# Patient Record
Sex: Female | Born: 1965 | Race: White | Hispanic: No | Smoking: Never smoker
Health system: Southern US, Community
[De-identification: ages and names within clinical notes are randomized; demographics above are authoritative.]

## PROBLEM LIST (undated history)

## (undated) DIAGNOSIS — Q211 Atrial septal defect: Secondary | ICD-10-CM

## (undated) DIAGNOSIS — S3550XA Injury of unspecified iliac blood vessel(s), initial encounter: Secondary | ICD-10-CM

## (undated) DIAGNOSIS — R51 Headache: Secondary | ICD-10-CM

## (undated) DIAGNOSIS — I471 Supraventricular tachycardia, unspecified: Secondary | ICD-10-CM

## (undated) DIAGNOSIS — R29818 Other symptoms and signs involving the nervous system: Secondary | ICD-10-CM

## (undated) DIAGNOSIS — E039 Hypothyroidism, unspecified: Secondary | ICD-10-CM

## (undated) DIAGNOSIS — Q2112 Patent foramen ovale: Secondary | ICD-10-CM

## (undated) DIAGNOSIS — N200 Calculus of kidney: Secondary | ICD-10-CM

## (undated) DIAGNOSIS — R002 Palpitations: Secondary | ICD-10-CM

## (undated) DIAGNOSIS — R519 Headache, unspecified: Secondary | ICD-10-CM

## (undated) DIAGNOSIS — N281 Cyst of kidney, acquired: Secondary | ICD-10-CM

## (undated) HISTORY — DX: Palpitations: R00.2

## (undated) HISTORY — PX: WISDOM TOOTH EXTRACTION: SHX21

## (undated) HISTORY — PX: KIDNEY STONE SURGERY: SHX686

## (undated) HISTORY — PX: ABDOMINAL HYSTERECTOMY: SHX81

## (undated) HISTORY — PX: THYROIDECTOMY: SHX17

## (undated) HISTORY — PX: PARATHYROIDECTOMY: SHX19

## (undated) HISTORY — DX: Patent foramen ovale: Q21.12

## (undated) HISTORY — DX: Atrial septal defect: Q21.1

## (undated) HISTORY — PX: TONSILLECTOMY: SUR1361

## (undated) HISTORY — PX: BREAST SURGERY: SHX581

---

## 1999-12-21 HISTORY — PX: LYMPH NODE BIOPSY: SHX201

## 2001-12-20 HISTORY — PX: KIDNEY SURGERY: SHX687

## 2004-08-14 ENCOUNTER — Encounter: Admission: RE | Admit: 2004-08-14 | Discharge: 2004-08-14 | Payer: Self-pay | Admitting: Obstetrics and Gynecology

## 2004-09-04 ENCOUNTER — Ambulatory Visit (HOSPITAL_COMMUNITY): Admission: RE | Admit: 2004-09-04 | Discharge: 2004-09-04 | Payer: Self-pay | Admitting: Obstetrics and Gynecology

## 2005-08-26 ENCOUNTER — Encounter: Admission: RE | Admit: 2005-08-26 | Discharge: 2005-08-26 | Payer: Self-pay | Admitting: Obstetrics and Gynecology

## 2005-12-16 ENCOUNTER — Ambulatory Visit (HOSPITAL_BASED_OUTPATIENT_CLINIC_OR_DEPARTMENT_OTHER): Admission: RE | Admit: 2005-12-16 | Discharge: 2005-12-16 | Payer: Self-pay | Admitting: Urology

## 2005-12-16 ENCOUNTER — Encounter (INDEPENDENT_AMBULATORY_CARE_PROVIDER_SITE_OTHER): Payer: Self-pay | Admitting: Specialist

## 2005-12-16 ENCOUNTER — Ambulatory Visit (HOSPITAL_COMMUNITY): Admission: RE | Admit: 2005-12-16 | Discharge: 2005-12-16 | Payer: Self-pay | Admitting: Urology

## 2006-09-12 ENCOUNTER — Encounter: Admission: RE | Admit: 2006-09-12 | Discharge: 2006-09-12 | Payer: Self-pay | Admitting: Obstetrics and Gynecology

## 2006-09-20 ENCOUNTER — Encounter: Admission: RE | Admit: 2006-09-20 | Discharge: 2006-09-20 | Payer: Self-pay | Admitting: Obstetrics and Gynecology

## 2006-12-20 HISTORY — PX: BREAST IMPLANT EXCHANGE: SHX6296

## 2008-01-09 ENCOUNTER — Encounter: Admission: RE | Admit: 2008-01-09 | Discharge: 2008-01-09 | Payer: Self-pay | Admitting: Obstetrics and Gynecology

## 2008-12-20 HISTORY — PX: ELBOW SURGERY: SHX618

## 2009-04-28 ENCOUNTER — Encounter: Admission: RE | Admit: 2009-04-28 | Discharge: 2009-04-28 | Payer: Self-pay | Admitting: Obstetrics and Gynecology

## 2009-04-30 ENCOUNTER — Encounter: Admission: RE | Admit: 2009-04-30 | Discharge: 2009-04-30 | Payer: Self-pay | Admitting: Obstetrics and Gynecology

## 2010-05-15 ENCOUNTER — Encounter: Admission: RE | Admit: 2010-05-15 | Discharge: 2010-05-15 | Payer: Self-pay | Admitting: Obstetrics and Gynecology

## 2010-05-21 ENCOUNTER — Encounter: Admission: RE | Admit: 2010-05-21 | Discharge: 2010-05-21 | Payer: Self-pay | Admitting: Obstetrics and Gynecology

## 2011-01-10 ENCOUNTER — Encounter: Payer: Self-pay | Admitting: Obstetrics and Gynecology

## 2011-05-07 NOTE — Op Note (Signed)
Cheryl Irwin, Cheryl Irwin              ACCOUNT NO.:  [id number]   MEDICAL RECORD NO.:  [id number]          PATIENT TYPE:  AMB   LOCATION:  [facility]                         FACILITY:  [facility]   PHYSICIAN:  Ronald L. Earlene Plater, M.D.  DATE OF BIRTH:  Jun 14, 1966   DATE OF PROCEDURE:  12/16/2005  DATE OF DISCHARGE:                                 OPERATIVE REPORT   PREOPERATIVE DIAGNOSIS:  Gross hematuria, history of right ureteral  reimplantation, and positive MMP22.   PROCEDURE:  Cystourethroscopy, bilateral retrograde ureteral pyelograms,  bladder and bilateral renal pelvis washings.   SURGEON:  Lucrezia Starch. Earlene Plater, M.D.   ANESTHESIA:  LMA.   ESTIMATED BLOOD LOSS:  Negligible.   TUBES:  None.   COMPLICATIONS:  None.   INDICATIONS FOR PROCEDURE:  Ms. Messineo is a lovely 45 year old white female  who presents with a rather complicated history.  She had undergone a  hysterectomy in 1997 and subsequently in 2002 in [place] and subsequently  developed gross hematuria and right flank pain in 2002.  She was found to  have an obstructed ureter secondary to the hysterectomy and a right  hydroureteronephrosis.  She underwent a right ureteral reimplant complicated  by an iliac artery injury requiring thrombectomy and was noted approximately  2 years ago on IVP to have still some hydronephrosis, however, felt the  kidney remained 75 to 80% functional and was stable.  She was doing well,  but developed again gross hematuria and passed clots.  She was concerned and  wanted a formal workup.  In the office an NMP22 was noted to be positive and  on CT scan, she was found to have two 3 mm calculi in the right kidney with  no obstruction, 1.6 cm Bosnic category I cyst of the upper pole of the right  kidney.  Two small stones were in the upper pole of the right kidney and  were peripheral and did not appear to be problematic.  No hydronephrosis or  other significant abnormalities were noted.  After understanding  risks,  benefits, and alternatives, she elected to proceed with above procedure.   DESCRIPTION OF PROCEDURE:  The patient was placed in the supine position.  After proper LMA anesthesia, she was placed in the dorsal lithotomy position  and prepped and draped with Betadine in a sterile fashion.  Cystourethroscopy was performed with a 22.5 [place] Olympus panendoscope.  Utilizing the 12 and 70 degree lenses, the bladder was carefully inspected.  It had smooth wall.  Efflux of clear urine was noted from the normally  placed left ureteral orifice.  The right defunctionalized ureteral orifice  was in the normal location and the reimplanted ureter was noted at the right  posterior bladder wall and appeared to be fully patent.  A 6 French open-  ended catheter was used to perform a retrograde ureteral pyelogram on the  right side.  There was some angulation of the ureter in the lower portion,  but it appeared to be fully patent.  It appeared to drain well and no  significant filling defects were noted to be within the system.  It might be  noted at the level of the vessels, there was some angulation noted, but it  did not appear to be a filling defect.  The catheter was then passed into  the right renal pelvis and barbotage cytologies were obtained and submitted  for cytology.  Similar left retrograde ureteral pyelogram was performed and  was noted to be essentially normal.  It drained normal and left renal pelvic  washings were performed in a similar manner.  It might be noted that  barbotage bladder cytology has been performed and submitted to pathology  when the bladder was initially inspected.  Again, the system appeared to  drain well.  Efflux of clear urine was noted from the left ureteral orifice  as noted and efflux was noted from the reimplanted right ureteral orifice.  The patient tolerated the procedure well and was taken to the recovery room  stable.      Ronald L. Earlene Plater, M.D.   Electronically Signed     RLD/MEDQ  D:  12/16/2005  T:  12/16/2005  Job:  [id number]   cc:   Sherry A. Rosalio Macadamia, M.D.  Fax: [phone]

## 2011-05-26 ENCOUNTER — Other Ambulatory Visit: Payer: Self-pay | Admitting: Obstetrics & Gynecology

## 2011-05-26 DIAGNOSIS — Z1231 Encounter for screening mammogram for malignant neoplasm of breast: Secondary | ICD-10-CM

## 2011-06-10 ENCOUNTER — Ambulatory Visit
Admission: RE | Admit: 2011-06-10 | Discharge: 2011-06-10 | Disposition: A | Discharge status: Home / Self Care | Payer: 59 | Source: Ambulatory Visit | Attending: Obstetrics & Gynecology | Admitting: Obstetrics & Gynecology

## 2011-06-10 ENCOUNTER — Other Ambulatory Visit: Payer: Self-pay | Admitting: Obstetrics & Gynecology

## 2011-06-10 DIAGNOSIS — N63 Unspecified lump in unspecified breast: Secondary | ICD-10-CM

## 2011-06-10 DIAGNOSIS — Z1231 Encounter for screening mammogram for malignant neoplasm of breast: Secondary | ICD-10-CM

## 2012-07-28 ENCOUNTER — Ambulatory Visit (INDEPENDENT_AMBULATORY_CARE_PROVIDER_SITE_OTHER): Payer: 59

## 2012-07-29 ENCOUNTER — Ambulatory Visit (INDEPENDENT_AMBULATORY_CARE_PROVIDER_SITE_OTHER): Payer: 59 | Admitting: Family Medicine

## 2012-07-29 VITALS — BP 96/62 | HR 63 | Temp 98.2°F | Resp 16 | Ht 63.0 in | Wt 117.8 lb

## 2012-07-29 DIAGNOSIS — T169XXA Foreign body in ear, unspecified ear, initial encounter: Secondary | ICD-10-CM

## 2012-07-29 DIAGNOSIS — H60339 Swimmer's ear, unspecified ear: Secondary | ICD-10-CM

## 2012-07-29 DIAGNOSIS — H9209 Otalgia, unspecified ear: Secondary | ICD-10-CM

## 2012-07-29 DIAGNOSIS — H6090 Unspecified otitis externa, unspecified ear: Secondary | ICD-10-CM

## 2012-07-29 NOTE — Progress Notes (Signed)
  Subjective:    Patient ID: Cheryl Irwin, female    DOB: 1966/12/17, 46 y.o.   MRN: [id number]  HPI 46 year old female comes into our office today with complaints of left ear pain she went out of town for 6 weeks and returned with and ear ache she went to see Dr Barton Dubois she prescribed her with Augmentin it didn't help now the ear is closed up she states that it fills like something is in it The problem began after going [place] in [place] about 6 weeks ago.  Review of Systems  HENT: Positive for hearing loss (fills like some is in it foe 3 dayd) and ear pain. Ear discharge: not in 3 to 4 days but it was some drainage.        Objective:   Physical Exam Left ear canal:  Small amount of debris in left ear with pain on manipulation with left ear canal swelling    foreign body irrigated out:  Appears to be end of cotton swab Assessment & Plan:  Otitis externa  Plan: cipro 500 bid x 7 days Stop the augmentin

## 2012-10-19 ENCOUNTER — Other Ambulatory Visit: Payer: Self-pay | Admitting: Obstetrics & Gynecology

## 2012-10-19 DIAGNOSIS — N6310 Unspecified lump in the right breast, unspecified quadrant: Secondary | ICD-10-CM

## 2012-10-30 ENCOUNTER — Ambulatory Visit
Admission: RE | Admit: 2012-10-30 | Discharge: 2012-10-30 | Disposition: A | Discharge status: Home / Self Care | Payer: 59 | Source: Ambulatory Visit | Attending: Obstetrics & Gynecology | Admitting: Obstetrics & Gynecology

## 2012-10-30 DIAGNOSIS — N6310 Unspecified lump in the right breast, unspecified quadrant: Secondary | ICD-10-CM

## 2013-04-30 ENCOUNTER — Other Ambulatory Visit: Payer: Self-pay | Admitting: Surgery

## 2013-05-21 ENCOUNTER — Other Ambulatory Visit: Payer: Self-pay | Admitting: Surgery

## 2013-07-19 ENCOUNTER — Encounter: Payer: Self-pay | Admitting: Cardiovascular Disease

## 2013-07-19 ENCOUNTER — Ambulatory Visit (INDEPENDENT_AMBULATORY_CARE_PROVIDER_SITE_OTHER): Payer: 59 | Admitting: Cardiovascular Disease

## 2013-07-19 VITALS — BP 100/72 | HR 73 | Ht 63.0 in | Wt 112.8 lb

## 2013-07-19 DIAGNOSIS — R079 Chest pain, unspecified: Secondary | ICD-10-CM

## 2013-07-19 DIAGNOSIS — Q2111 Secundum atrial septal defect: Secondary | ICD-10-CM

## 2013-07-19 DIAGNOSIS — Q211 Atrial septal defect: Secondary | ICD-10-CM | POA: Insufficient documentation

## 2013-07-19 DIAGNOSIS — N429 Disorder of prostate, unspecified: Secondary | ICD-10-CM | POA: Insufficient documentation

## 2013-07-19 DIAGNOSIS — R002 Palpitations: Secondary | ICD-10-CM

## 2013-07-19 LAB — CBC
HCT: 37.7 % (ref 36.0–46.0)
Platelets: 249 10*3/uL (ref 150–400)
RDW: 13.4 % (ref 11.5–15.5)
WBC: 6.5 10*3/uL (ref 4.0–10.5)

## 2013-07-19 LAB — COMPREHENSIVE METABOLIC PANEL
ALT: 15 U/L (ref 0–35)
CO2: 28 mEq/L (ref 19–32)
Calcium: 8.6 mg/dL (ref 8.4–10.5)
Chloride: 103 mEq/L (ref 96–112)
Creat: 0.77 mg/dL (ref 0.50–1.10)
Total Protein: 7 g/dL (ref 6.0–8.3)

## 2013-07-19 LAB — T4, FREE: Free T4: 1.29 ng/dL (ref 0.80–1.80)

## 2013-07-19 NOTE — Patient Instructions (Addendum)
Dr Allyson Sabal has ordered an echocardiogram with bubble study and an exercise myoview (stress test)  We will schedule you to have a loop recorder placed next week by Dr Royann Shivers at [facility]  Bloodwork to be done today and at your convenience

## 2013-07-19 NOTE — Assessment & Plan Note (Signed)
Patient has complained of episodes of tachypalpitations or self-limited lasting up to 30 seconds at a time beginning at age 47 occurring several times a year. I really saw her back in 2008 and may have gotten a Holter monitor. I did get a 2-D echo bubble study that showed a PFO by her account. She was in the [place] last week and developed an episode of tachypalpitations with chest pain rating to her jaw neck and arm associated with shortness of breath lasted 3 hours. Since that time she's felt weak. I'm going to obtain routine labs including a TSH since she is on Synthroid and a troponin to make sure she didn't have a ischemic episode although it may be part of doubt that this may have no relevance. Am also going to get 2-D echocardiogram, exercise Myoview stress test and implant a loop recorder to determine whether or not there is an arrhythmogenic component, ventricular or supraventricular.

## 2013-07-19 NOTE — Progress Notes (Signed)
07/19/2013 CHRYSTEN WOULFE   [birth year]  [id number]  Primary Physician Mady Gemma PA-C Primary Cardiologist: Runell Gess MD Roseanne Reno   HPI:  Cheryl Irwin is a delightful 47 year old thin and fit appearing married Caucasian female mother of 2 children who works as a Airline pilot at [employer]. Her primary care doctor is Mady Gemma . And her OB/GYN is Dr. Juliene Pina.she has a history of tachycardia palpitations dating back to age 81 occurring several times a year which were self-limited. She travels internationally he was recently [place] [employer]. She developed onset of tachycardia palpitations with chest pressure radiating to her neck arm and left upper extremity associated with shortness of breath and diaphoresis this lasted approximately 3 hours and then resolved spontaneously. She's been weak ever since. Her father did have history of myocardial function and age 32 she has no other cardiac risk factors.   Current Outpatient Prescriptions  Medication Sig Dispense Refill  . estradiol (ESTRACE) 2 MG tablet Take 2 mg by mouth daily.      Marland Kitchen levothyroxine (SYNTHROID, LEVOTHROID) 75 MCG tablet Take 75 mcg by mouth daily.       No current facility-administered medications for this visit.    Allergies  Allergen Reactions  . Sulfa Antibiotics Hives    History   Social History  . Marital Status: Married    Spouse Name: N/A    Number of Children: N/A  . Years of Education: N/A   Occupational History  . Not on file.   Social History Main Topics  . Smoking status: Never Smoker   . Smokeless tobacco: Not on file  . Alcohol Use: Not on file  . Drug Use: Not on file  . Sexually Active: Not on file   Other Topics Concern  . Not on file   Social History Narrative  . No narrative on file     Review of Systems: General: negative for chills, fever, night sweats or weight changes.  Cardiovascular: negative for chest pain, dyspnea on  exertion, edema, orthopnea, palpitations, paroxysmal nocturnal dyspnea or shortness of breath Dermatological: negative for rash Respiratory: negative for cough or wheezing Urologic: negative for hematuria Abdominal: negative for nausea, vomiting, diarrhea, bright red blood per rectum, melena, or hematemesis Neurologic: negative for visual changes, syncope, or dizziness All other systems reviewed and are otherwise negative except as noted above.    Blood pressure 100/72, pulse 73, height 5\' 3"  (1.6 m), weight 112 lb 12.8 oz (51.166 kg).  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 I really saw her back in 2008 and may have gotten a Holter monitor. I did get a 2-D echo bubble study that showed a PFO by her account. She was [place] last [place] and developed an episode of tachypalpitations with chest pain rating to her jaw neck and arm associated with shortness of breath lasted 3 hours. Since that time she's felt weak. I'm going to  obtain routine labs including a TSH since she is on Synthroid and a troponin to make sure she didn't have a ischemic episode although it may be part of doubt that this may have no relevance. Am also going to get 2-D echocardiogram, exercise Myoview stress test and implant a loop recorder to determine whether or not there is an arrhythmogenic component, ventricular or  supraventricular.      Runell Gess MD FACP,FACC,FAHA, [facility] 07/19/2013 3:05 PM

## 2013-07-20 DIAGNOSIS — R002 Palpitations: Secondary | ICD-10-CM

## 2013-07-20 HISTORY — DX: Palpitations: R00.2

## 2013-07-20 LAB — LIPID PANEL
Cholesterol: 191 mg/dL (ref 0–200)
HDL: 65 mg/dL (ref >39)
Triglycerides: 100 mg/dL (ref <150)

## 2013-07-23 ENCOUNTER — Encounter (HOSPITAL_COMMUNITY): Payer: Self-pay | Admitting: Pharmacy Technician

## 2013-07-24 ENCOUNTER — Encounter: Payer: Self-pay | Admitting: *Deleted

## 2013-07-26 ENCOUNTER — Ambulatory Visit (HOSPITAL_COMMUNITY)
Admission: RE | Admit: 2013-07-26 | Discharge: 2013-07-26 | Disposition: A | Discharge status: Home / Self Care | Payer: 59 | Source: Ambulatory Visit | Attending: Cardiovascular Disease | Admitting: Cardiovascular Disease

## 2013-07-26 ENCOUNTER — Ambulatory Visit (HOSPITAL_BASED_OUTPATIENT_CLINIC_OR_DEPARTMENT_OTHER)
Admission: RE | Admit: 2013-07-26 | Discharge: 2013-07-26 | Disposition: A | Discharge status: Home / Self Care | Payer: 59 | Source: Ambulatory Visit | Attending: Cardiovascular Disease | Admitting: Cardiovascular Disease

## 2013-07-26 DIAGNOSIS — R079 Chest pain, unspecified: Secondary | ICD-10-CM

## 2013-07-26 DIAGNOSIS — R002 Palpitations: Secondary | ICD-10-CM

## 2013-07-26 DIAGNOSIS — Q211 Atrial septal defect: Secondary | ICD-10-CM

## 2013-07-26 MED ORDER — TECHNETIUM TC 99M SESTAMIBI GENERIC - CARDIOLITE
10.0000 | Freq: Once | INTRAVENOUS | Status: AC | PRN
  Administered 2013-07-26: 10 via INTRAVENOUS

## 2013-07-26 MED ORDER — TECHNETIUM TC 99M SESTAMIBI GENERIC - CARDIOLITE
30.0000 | Freq: Once | INTRAVENOUS | Status: AC | PRN
  Administered 2013-07-26: 30 via INTRAVENOUS

## 2013-07-26 NOTE — Progress Notes (Signed)
[facility]   2D echo with bubble study completed 07/26/2013.   Veda Canning, RDCS

## 2013-07-26 NOTE — Procedures (Addendum)
[facility] [address] [place] [address] [phone]  Cardiology Nuclear Med Study  JENISSE VULLO is a 47 y.o. female     MRN : [id number]     DOB: 25-Aug-1966  Procedure Date: 07/26/2013  Nuclear Med Background Indication for Stress Test:  Evaluation for Ischemia History:  PFO Cardiac Risk Factors: Family History - CAD  Symptoms:  Chest Pain, Dizziness, Fatigue, Light-Headedness, Near Syncope, Palpitations, SOB, Syncope and TACHYCARDIA   Nuclear Pre-Procedure Caffeine/Decaff Intake:  7:00pm NPO After: 5:00am   IV Site: R Antecubital  IV 0.9% NS with Angio Cath:  22g  Chest Size (in):  N/A IV Started by: Emmit Pomfret, RN  Height: 5\' 3"  (1.6 m)  Cup Size: C with IMPLANTS  BMI:  Body mass index is 19.84 kg/(m^2). Weight:  112 lb (50.803 kg)   Tech Comments:  BILATERAL BREAST IMPLANTS     Nuclear Med Study 1 or 2 day study: 1 day  Stress Test Type:  Stress  Order Authorizing Provider:  Nanetta Batty, MD   Resting Radionuclide: Technetium 69m Sestamibi  Resting Radionuclide Dose: 10.5 mCi   Stress Radionuclide:  Technetium 51m Sestamibi  Stress Radionuclide Dose: 31.2 mCi           Stress Protocol Rest HR: 72 Stress HR:  166  Rest BP:107/81 Stress BP: 138/82  Exercise Time (min): 11 METS: 13.4   Predicted Max HR: 175 bpm % Max HR: 94.86 bpm Rate Pressure Product: 86578  Dose of Adenosine (mg):  n/a Dose of Lexiscan: n/a mg  Dose of Atropine (mg): n/a Dose of Dobutamine: n/a mcg/kg/min (at max HR)  Stress Test Technologist: Esperanza Sheets, CCT Nuclear Technologist: Koren Shiver, CNMT   Rest Procedure:  Myocardial perfusion imaging was performed at rest 45 minutes following the intravenous administration of Technetium 42m Sestamibi. Stress Procedure:  The patient performed treadmill exercise using a Bruce  Protocol for 11 minutes. The patient stopped due to target HR achieved and denied any chest pain.   There were no significant ST-T wave changes.  Technetium 44m Sestamibi was injected at peak exercise and myocardial perfusion imaging was performed after a brief delay.  Transient Ischemic Dilatation (Normal <1.22):  0.69 Lung/Heart Ratio (Normal <0.45):  0.38 QGS EDV:  42 ml QGS ESV:  4 ml LV Ejection Fraction: 89%     Rest ECG: NSR - Normal EKG  Stress ECG: No significant change from baseline ECG  QPS Raw Data Images:  Normal; no motion artifact; normal heart/lung ratio. Stress Images:  There is decreased uptake in the anterior wall. Rest Images:  Comparison with the stress images reveals no significant change. Subtraction (SDS):  There is a fixed anteriour defect that is most consistent with breast attenuation.  Impression Exercise Capacity:  Excellent exercise capacity. BP Response:  Normal blood pressure response. Clinical Symptoms:  No significant symptoms noted. ECG Impression:  No significant ST segment change suggestive of ischemia. Comparison with Prior Nuclear Study: No previous nuclear study performed  Overall Impression:  Low risk stress nuclear study with breast attenuation artifact.  LV Wall Motion:  NL LV Function; NL Wall Motion   Zurisadai Helminiak, MD  07/26/2013 12:40 PM

## 2013-07-27 ENCOUNTER — Encounter (HOSPITAL_COMMUNITY)
Admission: RE | Disposition: A | Discharge status: Home / Self Care | Payer: Self-pay | Source: Ambulatory Visit | Attending: Cardiovascular Disease

## 2013-07-27 ENCOUNTER — Ambulatory Visit (HOSPITAL_COMMUNITY)
Admission: RE | Admit: 2013-07-27 | Discharge: 2013-07-27 | Disposition: A | Discharge status: Home / Self Care | Payer: 59 | Source: Ambulatory Visit | Attending: Cardiovascular Disease | Admitting: Cardiovascular Disease

## 2013-07-27 DIAGNOSIS — Z79899 Other long term (current) drug therapy: Secondary | ICD-10-CM | POA: Insufficient documentation

## 2013-07-27 DIAGNOSIS — Z8249 Family history of ischemic heart disease and other diseases of the circulatory system: Secondary | ICD-10-CM | POA: Insufficient documentation

## 2013-07-27 DIAGNOSIS — R002 Palpitations: Secondary | ICD-10-CM | POA: Insufficient documentation

## 2013-07-27 DIAGNOSIS — Z882 Allergy status to sulfonamides status: Secondary | ICD-10-CM | POA: Insufficient documentation

## 2013-07-27 HISTORY — PX: LOOP RECORDER IMPLANT: SHX5477

## 2013-07-27 SURGERY — LOOP RECORDER IMPLANT
Anesthesia: LOCAL

## 2013-07-27 MED ORDER — MUPIROCIN 2 % EX OINT
TOPICAL_OINTMENT | Freq: Two times a day (BID) | CUTANEOUS | Status: DC
  Filled 2013-07-27 (×2): qty 22

## 2013-07-27 NOTE — Op Note (Addendum)
LOOP RECORDER IMPLANT   Procedure report  Procedure performed:  1. Loop recorder implantation  2. Light sedation  Reason for procedure:  1. Palpitations  Procedure performed by:  Thurmon Fair, MD  Complications:  None  Estimated blood loss:  <5 mL  Medications administered during procedure:  Ancef 2 g intravenously; no sedation was administered in the lab  Device details:  Medtronic Reveal Linq model number [id number], serial number [id number] Procedure details:  After the risks and benefits of the procedure were discussed the patient provided informed consent. He was brought to the cardiac catheter lab in the fasting state. The patient was prepped and draped in usual sterile fashion. Local anesthesia with 1% lidocaine was administered to an area 2 cm to the left of the sternum in the 4th intercostal space. A horizontal incision was made using the incision tool. The introducer was then used to create a subcutaneous tunnel and carefully deploy the device. Local pressure was held to ensure hemostasis. Device testing showed excellent electrograms. The incision was closed with SteriStrips and a sterile dressing was applied.   Thurmon Fair, MD, [facility] [phone] office [phone] pager 07/27/2013 10:40 AM

## 2013-07-27 NOTE — H&P (Signed)
  Date of Initial H&P: 07/19/13, Dr. Allyson Sabal  History reviewed, patient examined, no change in status, stable for surgery. Here for loop recorder implantation for unexplained palpitations. This procedure has been fully reviewed with the patient and written informed consent has been obtained. Thurmon Fair, MD, [facility] [phone] office [phone] pager

## 2013-08-02 ENCOUNTER — Encounter: Payer: Self-pay | Admitting: Physician Assistant

## 2013-08-02 ENCOUNTER — Ambulatory Visit (INDEPENDENT_AMBULATORY_CARE_PROVIDER_SITE_OTHER): Payer: 59 | Admitting: Physician Assistant

## 2013-08-02 VITALS — BP 114/66 | HR 68 | Ht 63.0 in | Wt 111.1 lb

## 2013-08-02 DIAGNOSIS — R002 Palpitations: Secondary | ICD-10-CM

## 2013-08-02 NOTE — Progress Notes (Signed)
Date:  08/02/2013   ID:  Cheryl Irwin, DOB [birth year], MRN [id number]  PCP:  Lilia Argue  Primary Cardiologist:  Allyson Sabal     History of Present Illness: Cheryl Irwin is a delightful 47 year old thin and fit appearing married Caucasian female mother of 2 children who works as a Airline pilot at [employer].  Her primary care doctor is Mady Gemma and her OB/GYN is Dr. Juliene Pina.  She has a history of tachycardia palpitations dating back to age 60 occurring several times a year which were self-limited. She travels internationally he was recently [place] [employer]. She developed onset of tachycardia palpitations with chest pressure radiating to her neck arm and left upper extremity associated with shortness of breath and diaphoresis this lasted approximately 3 hours and then resolved spontaneously. She's been weak ever since.  Her father did have history of myocardial function and age 30 she has no other cardiac risk factors.  Presents today for follow up after receiving a loop recorder implant by Dr. Royann Shivers.  She reports no palpitations, erythema or swelling at the loop recorder site and denies nausea, vomiting, fever, chest pain, shortness of breath, orthopnea, dizziness, PND, cough, congestion.  She is being treated for a UTI with an Antibiotic.  Wt Readings from Last 3 Encounters:  08/02/13 111 lb 1.6 oz (50.395 kg)  07/27/13 111 lb (50.349 kg)  07/27/13 111 lb (50.349 kg)     Past Medical History  Diagnosis Date  . Hypothyroid   . PFO (patent foramen ovale)   . Palpitations   . Chest pain     Current Outpatient Prescriptions  Medication Sig Dispense Refill  . amoxicillin-clavulanate (AUGMENTIN) 875-125 MG per tablet Take 1 tablet by mouth 2 (two) times daily. X 14 days      . estradiol (ESTRACE) 2 MG tablet Take 2 mg by mouth daily.      . fluconazole (DIFLUCAN) 150 MG tablet Take 150 mg by mouth once.      Marland Kitchen levothyroxine (SYNTHROID,  LEVOTHROID) 75 MCG tablet Take 75 mcg by mouth daily.      . Multiple Vitamin (MULTIVITAMIN WITH MINERALS) TABS tablet Take 1 tablet by mouth daily.      . nitrofurantoin, macrocrystal-monohydrate, (MACROBID) 100 MG capsule Take 100 mg by mouth daily. Prophylaxis if needed       No current facility-administered medications for this visit.    Allergies:    Allergies  Allergen Reactions  . Sulfa Antibiotics Hives    Social History:  The patient  reports that she has never smoked. She has never used smokeless tobacco. She reports that she drinks about 1.0 ounces of alcohol per week. She reports that she does not use illicit drugs.   Family history:   Family History  Problem Relation Age of Onset  . Cancer Mother   . High blood pressure Mother   . Hyperlipidemia Mother   . Lupus Mother   . Hypertension Father   . Hyperlipidemia Father     ROS:  Please see the history of present illness.  All other systems reviewed and negative.   PHYSICAL EXAM: VS:  BP 114/66  Pulse 68  Ht 5\' 3"  (1.6 m)  Wt 111 lb 1.6 oz (50.395 kg)  BMI 19.69 kg/m2 Well nourished, well developed, in no acute distress HEENT: Pupils are equal round react to light accommodation extraocular movements are intact.  Cardiac: Regular rate and rhythm without murmurs rubs or gallops. Lungs:  clear to auscultation bilaterally, no wheezing, rhonchi or rales Ext: no lower extremity edema.  2+ radial and dorsalis pedis pulses. Skin: warm and dry.  No erythema or edema at loop recorder site. Neuro:  Grossly normal  ASSESSMENT AND PLAN:  Problem List Items Addressed This Visit   Palpitations - Primary     Patient is status post loop recorder implant by Dr. Salena Saner..  no signs of infection. Patient reports no palpitations.  She is currently on antibiotic for urinary tract infection.

## 2013-08-02 NOTE — Patient Instructions (Signed)
I would avoid hot tubs and soaking in a pool for three weeks.  Otherwise resume normal activities.  Follow up with Dr. Allyson Sabal in six months.

## 2013-08-02 NOTE — Assessment & Plan Note (Signed)
Patient is status post loop recorder implant by Dr. Salena Saner..  no signs of infection. Patient reports no palpitations.  She is currently on antibiotic for urinary tract infection.

## 2013-09-08 ENCOUNTER — Other Ambulatory Visit: Payer: Self-pay | Admitting: Cardiovascular Disease

## 2013-09-08 DIAGNOSIS — R002 Palpitations: Secondary | ICD-10-CM

## 2013-09-08 LAB — PACEMAKER DEVICE OBSERVATION

## 2013-09-15 ENCOUNTER — Encounter: Payer: Self-pay | Admitting: *Deleted

## 2013-09-15 LAB — REMOTE PACEMAKER DEVICE

## 2013-09-20 ENCOUNTER — Encounter: Payer: Self-pay | Admitting: Cardiovascular Disease

## 2013-10-25 ENCOUNTER — Other Ambulatory Visit: Payer: Self-pay

## 2013-11-09 ENCOUNTER — Other Ambulatory Visit: Payer: Self-pay | Admitting: Family Medicine

## 2013-11-09 ENCOUNTER — Ambulatory Visit
Admission: RE | Admit: 2013-11-09 | Discharge: 2013-11-09 | Disposition: A | Discharge status: Home / Self Care | Payer: 59 | Source: Ambulatory Visit | Attending: Family Medicine | Admitting: Family Medicine

## 2013-11-09 DIAGNOSIS — R109 Unspecified abdominal pain: Secondary | ICD-10-CM

## 2013-11-12 ENCOUNTER — Ambulatory Visit: Payer: 59

## 2013-12-01 ENCOUNTER — Encounter: Payer: Self-pay | Admitting: *Deleted

## 2013-12-03 ENCOUNTER — Ambulatory Visit (INDEPENDENT_AMBULATORY_CARE_PROVIDER_SITE_OTHER): Payer: 59

## 2013-12-03 ENCOUNTER — Other Ambulatory Visit: Payer: Self-pay

## 2013-12-03 DIAGNOSIS — R002 Palpitations: Secondary | ICD-10-CM

## 2013-12-03 DIAGNOSIS — Z1231 Encounter for screening mammogram for malignant neoplasm of breast: Secondary | ICD-10-CM

## 2013-12-03 LAB — MDC_IDC_ENUM_SESS_TYPE_REMOTE

## 2014-01-02 LAB — MDC_IDC_ENUM_SESS_TYPE_REMOTE

## 2014-01-04 ENCOUNTER — Ambulatory Visit
Admission: RE | Admit: 2014-01-04 | Discharge: 2014-01-04 | Disposition: A | Discharge status: Home / Self Care | Payer: 59 | Source: Ambulatory Visit

## 2014-01-04 DIAGNOSIS — Z1231 Encounter for screening mammogram for malignant neoplasm of breast: Secondary | ICD-10-CM

## 2014-02-12 ENCOUNTER — Ambulatory Visit (INDEPENDENT_AMBULATORY_CARE_PROVIDER_SITE_OTHER): Payer: 59 | Admitting: *Deleted

## 2014-02-12 DIAGNOSIS — R002 Palpitations: Secondary | ICD-10-CM

## 2014-02-12 LAB — PACEMAKER DEVICE OBSERVATION

## 2014-03-12 LAB — MDC_IDC_ENUM_SESS_TYPE_REMOTE

## 2014-03-15 ENCOUNTER — Ambulatory Visit (INDEPENDENT_AMBULATORY_CARE_PROVIDER_SITE_OTHER): Payer: 59 | Admitting: *Deleted

## 2014-03-15 DIAGNOSIS — R002 Palpitations: Secondary | ICD-10-CM

## 2014-03-15 LAB — MDC_IDC_ENUM_SESS_TYPE_REMOTE

## 2014-03-15 LAB — PACEMAKER DEVICE OBSERVATION

## 2014-03-27 ENCOUNTER — Ambulatory Visit
Admission: RE | Admit: 2014-03-27 | Discharge: 2014-03-27 | Disposition: A | Discharge status: Home / Self Care | Payer: 59 | Source: Ambulatory Visit | Attending: Family Medicine | Admitting: Family Medicine

## 2014-03-27 ENCOUNTER — Other Ambulatory Visit: Payer: Self-pay | Admitting: Family Medicine

## 2014-03-27 DIAGNOSIS — H539 Unspecified visual disturbance: Secondary | ICD-10-CM

## 2014-03-27 DIAGNOSIS — R200 Anesthesia of skin: Secondary | ICD-10-CM

## 2014-03-27 DIAGNOSIS — R4781 Slurred speech: Secondary | ICD-10-CM

## 2014-04-12 ENCOUNTER — Ambulatory Visit (INDEPENDENT_AMBULATORY_CARE_PROVIDER_SITE_OTHER): Payer: 59 | Admitting: *Deleted

## 2014-04-12 DIAGNOSIS — R002 Palpitations: Secondary | ICD-10-CM

## 2014-04-12 LAB — MDC_IDC_ENUM_SESS_TYPE_REMOTE

## 2014-05-14 ENCOUNTER — Encounter: Payer: 59 | Admitting: *Deleted

## 2014-05-14 ENCOUNTER — Ambulatory Visit: Payer: 59 | Admitting: *Deleted

## 2014-05-14 DIAGNOSIS — R002 Palpitations: Secondary | ICD-10-CM

## 2014-05-14 LAB — MDC_IDC_ENUM_SESS_TYPE_REMOTE

## 2014-07-10 ENCOUNTER — Ambulatory Visit (INDEPENDENT_AMBULATORY_CARE_PROVIDER_SITE_OTHER): Payer: 59 | Admitting: *Deleted

## 2014-07-10 DIAGNOSIS — R002 Palpitations: Secondary | ICD-10-CM

## 2014-07-10 LAB — MDC_IDC_ENUM_SESS_TYPE_REMOTE

## 2014-07-17 NOTE — Progress Notes (Signed)
Loop recorder 

## 2014-07-18 ENCOUNTER — Encounter (HOSPITAL_COMMUNITY): Payer: Self-pay | Admitting: Emergency Medicine

## 2014-07-18 ENCOUNTER — Emergency Department (HOSPITAL_COMMUNITY)
Admission: EM | Admit: 2014-07-18 | Discharge: 2014-07-18 | Disposition: A | Discharge status: Home / Self Care | Payer: 59 | Attending: Emergency Medicine | Admitting: Emergency Medicine

## 2014-07-18 DIAGNOSIS — E039 Hypothyroidism, unspecified: Secondary | ICD-10-CM | POA: Diagnosis not present

## 2014-07-18 DIAGNOSIS — Q211 Atrial septal defect: Secondary | ICD-10-CM | POA: Diagnosis not present

## 2014-07-18 DIAGNOSIS — A09 Infectious gastroenteritis and colitis, unspecified: Secondary | ICD-10-CM

## 2014-07-18 DIAGNOSIS — R1084 Generalized abdominal pain: Secondary | ICD-10-CM | POA: Diagnosis present

## 2014-07-18 DIAGNOSIS — Z79899 Other long term (current) drug therapy: Secondary | ICD-10-CM | POA: Diagnosis not present

## 2014-07-18 DIAGNOSIS — R063 Periodic breathing: Secondary | ICD-10-CM | POA: Diagnosis not present

## 2014-07-18 DIAGNOSIS — Z792 Long term (current) use of antibiotics: Secondary | ICD-10-CM | POA: Diagnosis not present

## 2014-07-18 DIAGNOSIS — R300 Dysuria: Secondary | ICD-10-CM | POA: Diagnosis not present

## 2014-07-18 DIAGNOSIS — Q2111 Secundum atrial septal defect: Secondary | ICD-10-CM | POA: Diagnosis not present

## 2014-07-18 DIAGNOSIS — R1110 Vomiting, unspecified: Secondary | ICD-10-CM | POA: Insufficient documentation

## 2014-07-18 LAB — CBC
HCT: 35.3 % — ABNORMAL LOW (ref 36.0–46.0)
HEMOGLOBIN: 11.8 g/dL — AB (ref 12.0–15.0)
MCH: 29.1 pg (ref 26.0–34.0)
MCHC: 33.4 g/dL (ref 30.0–36.0)
MCV: 87.2 fL (ref 78.0–100.0)
PLATELETS: 197 10*3/uL (ref 150–400)
RBC: 4.05 MIL/uL (ref 3.87–5.11)
RDW: 13.4 % (ref 11.5–15.5)
WBC: 9.6 10*3/uL (ref 4.0–10.5)

## 2014-07-18 LAB — COMPREHENSIVE METABOLIC PANEL
ALK PHOS: 51 U/L (ref 39–117)
ALT: 14 U/L (ref 0–35)
AST: 16 U/L (ref 0–37)
Albumin: 3.4 g/dL — ABNORMAL LOW (ref 3.5–5.2)
Anion gap: 16 — ABNORMAL HIGH (ref 5–15)
BUN: 19 mg/dL (ref 6–23)
CALCIUM: 8.5 mg/dL (ref 8.4–10.5)
CO2: 23 mEq/L (ref 19–32)
Chloride: 99 mEq/L (ref 96–112)
Creatinine, Ser: 0.67 mg/dL (ref 0.50–1.10)
GFR calc non Af Amer: >90 mL/min (ref >90)
GLUCOSE: 107 mg/dL — AB (ref 70–99)
POTASSIUM: 3.6 meq/L — AB (ref 3.7–5.3)
SODIUM: 138 meq/L (ref 137–147)
TOTAL PROTEIN: 6.7 g/dL (ref 6.0–8.3)
Total Bilirubin: 0.3 mg/dL (ref 0.3–1.2)

## 2014-07-18 LAB — URINALYSIS, ROUTINE W REFLEX MICROSCOPIC
Bilirubin Urine: NEGATIVE
GLUCOSE, UA: NEGATIVE mg/dL
HGB URINE DIPSTICK: NEGATIVE
Ketones, ur: NEGATIVE mg/dL
LEUKOCYTES UA: NEGATIVE
Nitrite: NEGATIVE
PH: 7 (ref 5.0–8.0)
Protein, ur: NEGATIVE mg/dL
SPECIFIC GRAVITY, URINE: 1.019 (ref 1.005–1.030)
Urobilinogen, UA: 0.2 mg/dL (ref 0.0–1.0)

## 2014-07-18 LAB — I-STAT CG4 LACTIC ACID, ED: Lactic Acid, Venous: 2.32 mmol/L — ABNORMAL HIGH (ref 0.5–2.2)

## 2014-07-18 MED ORDER — MORPHINE SULFATE 4 MG/ML IJ SOLN
4.0000 mg | Freq: Once | INTRAMUSCULAR | Status: AC
  Administered 2014-07-18: 4 mg via INTRAVENOUS
  Filled 2014-07-18: qty 1

## 2014-07-18 MED ORDER — ONDANSETRON HCL 4 MG/2ML IJ SOLN
4.0000 mg | Freq: Once | INTRAMUSCULAR | Status: AC
  Administered 2014-07-18: 4 mg via INTRAVENOUS
  Filled 2014-07-18: qty 2

## 2014-07-18 MED ORDER — SODIUM CHLORIDE 0.9 % IV BOLUS (SEPSIS)
1000.0000 mL | Freq: Once | INTRAVENOUS | Status: AC
  Administered 2014-07-18: 1000 mL via INTRAVENOUS

## 2014-07-18 MED ORDER — PROMETHAZINE HCL 25 MG RE SUPP: 25.0000 mg | Freq: Four times a day (QID) | RECTAL | Status: DC | PRN

## 2014-07-18 MED ORDER — ONDANSETRON 4 MG PO TBDP: ORAL_TABLET | ORAL | Status: DC

## 2014-07-18 NOTE — ED Notes (Signed)
Pt encouraged to provide urine and stool sample when able.

## 2014-07-18 NOTE — ED Notes (Signed)
Pt reports returned from [place] yesterday with new onset of intermittent abdominal pain 2130 last night. Pt reports 3 episodes of vomiting and several episodes of diarrhea. Pt reports generalized body aches and chills as well.

## 2014-07-18 NOTE — ED Notes (Signed)
Gwendolyn GrantWalden MD reports on way to give pt update on labs and urine.

## 2014-07-18 NOTE — Discharge Instructions (Signed)

## 2014-07-18 NOTE — ED Notes (Signed)
Per pt, states visited the [place], got back last night and started having chills, abdominal cramping, dirrahea

## 2014-07-18 NOTE — ED Provider Notes (Signed)
CSN: [id number]     Arrival date & time 07/18/14  [id number] History   First MD Initiated Contact with Patient 07/18/14 [phone]     Chief Complaint  Patient presents with  . Abdominal Pain     (Consider location/radiation/quality/duration/timing/severity/associated sxs/prior Treatment) Patient is a 48 y.o. female presenting with abdominal pain. The history is provided by the patient.  Abdominal Pain Pain location:  Generalized Pain quality: cramping   Pain radiates to:  Does not radiate Pain severity:  Moderate Onset quality:  Gradual Timing:  Intermittent Progression:  Worsening Chronicity:  New Context: recent travel (returned from [place] last night, was eating raw fish, local foods, swimming in rivers)   Relieved by:  Nothing Worsened by:  Nothing tried Associated symptoms: chills, diarrhea, dysuria and vomiting   Associated symptoms: no anorexia, no belching, no fever and no hematuria   Diarrhea:    Quality:  Bloody   Number of occurrences:  >15 - started as watery, now progressed to mild blood   Severity:  Severe   Timing:  Constant   Progression:  Unchanged Vomiting:    Quality:  Stomach contents   Number of occurrences:  3 - mild blood in last episode of vomiting   Severity:  Moderate   Timing:  Constant   Progression:  Improving   Past Medical History  Diagnosis Date  . Hypothyroid   . PFO (patent foramen ovale)   . Palpitations   . Chest pain    Past Surgical History  Procedure Laterality Date  . Abdominal hysterectomy  29 yrs olds  . Kidney surgery  2003    to fix blockage in uretra  . Lymph node biopsy  2001  . Breast implant exchange  2008  . Tonsillectomy  as a child  . Elbow surgery Left 2010   Family History  Problem Relation Age of Onset  . Cancer Mother   . High blood pressure Mother   . Hyperlipidemia Mother   . Lupus Mother   . Hypertension Father   . Hyperlipidemia Father    History  Substance Use Topics  . Smoking status:  Never Smoker   . Smokeless tobacco: Never Used  . Alcohol Use: 1.0 - 1.5 oz/week    2-3 drink(s) per week   OB History   Grav Para Term Preterm Abortions TAB SAB Ect Mult Living                 Review of Systems  Constitutional: Positive for chills. Negative for fever.  Gastrointestinal: Positive for vomiting, abdominal pain and diarrhea. Negative for anorexia.  Genitourinary: Positive for dysuria. Negative for hematuria.  All other systems reviewed and are negative.     Allergies  Sulfa antibiotics  Home Medications   Prior to Admission medications   Medication Sig Start Date End Date Taking? Authorizing Provider  amoxicillin-clavulanate (AUGMENTIN) 875-125 MG per tablet Take 1 tablet by mouth 2 (two) times daily. X 14 days 07/30/13   Historical Provider, MD  estradiol (ESTRACE) 2 MG tablet Take 2 mg by mouth daily.    Historical Provider, MD  fluconazole (DIFLUCAN) 150 MG tablet Take 150 mg by mouth once. 07/31/13   Historical Provider, MD  levothyroxine (SYNTHROID, LEVOTHROID) 75 MCG tablet Take 75 mcg by mouth daily.    Historical Provider, MD  Multiple Vitamin (MULTIVITAMIN WITH MINERALS) TABS tablet Take 1 tablet by mouth daily.    Historical Provider, MD  nitrofurantoin, macrocrystal-monohydrate, (MACROBID) 100 MG capsule Take 100 mg by  mouth daily. Prophylaxis if needed 07/25/13   Historical Provider, MD   BP 126/70  Pulse 80  Temp(Src) 99.2 F (37.3 C) (Oral)  Resp 16  SpO2 100% Physical Exam  Nursing note and vitals reviewed. Constitutional: She is oriented to person, place, and time. She appears well-developed and well-nourished. No distress.  HENT:  Head: Normocephalic and atraumatic.  Eyes: EOM are normal. Pupils are equal, round, and reactive to light.  Neck: Normal range of motion. Neck supple.  Cardiovascular: Normal rate and regular rhythm.  Exam reveals no friction rub.   No murmur heard. Pulmonary/Chest: Effort normal and breath sounds normal. No  respiratory distress. She has no wheezes. She has no rales.  Abdominal: Soft. She exhibits no distension. There is no tenderness. There is no rebound.  Musculoskeletal: Normal range of motion. She exhibits no edema.  Neurological: She is alert and oriented to person, place, and time.  Skin: She is not diaphoretic.    ED Course  Procedures (including critical care time) Labs Review Labs Reviewed  STOOL CULTURE  OVA AND PARASITE EXAMINATION  CBC  COMPREHENSIVE METABOLIC PANEL  URINALYSIS, ROUTINE W REFLEX MICROSCOPIC  I-STAT CG4 LACTIC ACID, ED    Imaging Review No results found.   EKG Interpretation None      MDM   Final diagnoses:  Traveler's diarrhea    31F here with vomiting, diarrhea after recent visit to [place]. Returned last night. Traveling partner is sick with similar symptoms. Chills, arthralgias, crampy abdominal pain. Diarrhea watery, has progressed to bloody. Unable to take Cipro and imodium due to the vomiting. Vomiting x 3 with mild blood in last episode. Benign abdomen. Stool studies sent. Will check labs, hydrate. Labs ok, feeling better. No further diarrhea or vomiting. LIkely traveler's diarrhea. No fever, no white count elevation, doubt infectious diarrhea. Will hold off an antibiotics to wait for stool cultures. Given zofran, phenergan suppositories, instructed not to take together. Will f/u with PCP.  Dagmar Hait, MD 07/18/14 1236

## 2014-07-18 NOTE — ED Notes (Signed)
Pt reports abdominal cramping a little better but reports generalized aches almost gone. Pt reports will alert staff when would like something for pain and/or pain gets worse.

## 2014-08-15 ENCOUNTER — Ambulatory Visit (INDEPENDENT_AMBULATORY_CARE_PROVIDER_SITE_OTHER): Payer: 59 | Admitting: *Deleted

## 2014-08-15 DIAGNOSIS — R002 Palpitations: Secondary | ICD-10-CM

## 2014-08-15 LAB — MDC_IDC_ENUM_SESS_TYPE_REMOTE

## 2014-08-22 NOTE — Progress Notes (Signed)
Loop recorder 

## 2014-09-02 ENCOUNTER — Encounter: Payer: Self-pay | Admitting: Cardiovascular Disease

## 2014-09-13 ENCOUNTER — Ambulatory Visit (INDEPENDENT_AMBULATORY_CARE_PROVIDER_SITE_OTHER): Payer: 59 | Admitting: *Deleted

## 2014-09-13 DIAGNOSIS — R002 Palpitations: Secondary | ICD-10-CM

## 2014-09-13 LAB — MDC_IDC_ENUM_SESS_TYPE_REMOTE

## 2014-09-17 ENCOUNTER — Encounter: Payer: Self-pay | Admitting: Cardiovascular Disease

## 2014-09-18 ENCOUNTER — Encounter: Payer: Self-pay | Admitting: Cardiovascular Disease

## 2014-09-27 ENCOUNTER — Ambulatory Visit: Payer: 59 | Admitting: *Deleted

## 2014-09-27 DIAGNOSIS — R002 Palpitations: Secondary | ICD-10-CM

## 2014-09-27 NOTE — Progress Notes (Signed)
Loop recorder 

## 2014-10-08 ENCOUNTER — Encounter: Payer: Self-pay | Admitting: Cardiovascular Disease

## 2014-10-18 NOTE — Progress Notes (Signed)
Loop recorder 

## 2014-11-11 ENCOUNTER — Ambulatory Visit (INDEPENDENT_AMBULATORY_CARE_PROVIDER_SITE_OTHER): Payer: 59 | Admitting: *Deleted

## 2014-11-11 DIAGNOSIS — R002 Palpitations: Secondary | ICD-10-CM

## 2014-11-11 LAB — MDC_IDC_ENUM_SESS_TYPE_REMOTE
Date Time Interrogation Session: 20151123203027
Zone Setting Detection Interval: 2000 ms
Zone Setting Detection Interval: 3000 ms
Zone Setting Detection Interval: 320 ms

## 2014-11-20 NOTE — Progress Notes (Signed)
Loop recorder 

## 2014-11-28 ENCOUNTER — Encounter (HOSPITAL_COMMUNITY): Payer: Self-pay | Admitting: Cardiovascular Disease

## 2014-11-28 ENCOUNTER — Telehealth: Payer: Self-pay | Admitting: Cardiovascular Disease

## 2014-12-02 NOTE — Telephone Encounter (Signed)
Closed encounter °

## 2014-12-26 ENCOUNTER — Encounter: Payer: Self-pay | Admitting: Cardiovascular Disease

## 2015-01-13 ENCOUNTER — Ambulatory Visit (INDEPENDENT_AMBULATORY_CARE_PROVIDER_SITE_OTHER): Payer: 59 | Admitting: *Deleted

## 2015-01-13 DIAGNOSIS — R002 Palpitations: Secondary | ICD-10-CM

## 2015-01-13 LAB — MDC_IDC_ENUM_SESS_TYPE_REMOTE
MDC IDC SESS DTM: 20160126165611
MDC IDC SET ZONE DETECTION INTERVAL: 2000 ms
MDC IDC SET ZONE DETECTION INTERVAL: 320 ms
Zone Setting Detection Interval: 3000 ms

## 2015-01-14 ENCOUNTER — Encounter: Payer: Self-pay | Admitting: Cardiovascular Disease

## 2015-01-14 ENCOUNTER — Ambulatory Visit (INDEPENDENT_AMBULATORY_CARE_PROVIDER_SITE_OTHER): Payer: 59 | Admitting: Cardiovascular Disease

## 2015-01-14 VITALS — BP 117/82 | HR 63 | Ht 63.0 in | Wt 114.2 lb

## 2015-01-14 DIAGNOSIS — R002 Palpitations: Secondary | ICD-10-CM

## 2015-01-14 DIAGNOSIS — Q2112 Patent foramen ovale: Secondary | ICD-10-CM

## 2015-01-14 DIAGNOSIS — R55 Syncope and collapse: Secondary | ICD-10-CM | POA: Insufficient documentation

## 2015-01-14 DIAGNOSIS — Q211 Atrial septal defect: Secondary | ICD-10-CM

## 2015-01-14 LAB — MDC_IDC_ENUM_SESS_TYPE_INCLINIC
Zone Setting Detection Interval: 2000 ms
Zone Setting Detection Interval: 3000 ms
Zone Setting Detection Interval: 320 ms

## 2015-01-14 NOTE — Progress Notes (Signed)
Patient ID: Cheryl Irwin, female   DOB: 01-26-1966, 49 y.o.   MRN: [id number]     Reason for office visit Loop recorder check.  Una is here for an ILR yearly check. Her device has never recorded any events, but on two occasions shw had symptoms that could otherwise have been arrhythmia related.  In April 2015 she had possible aphasia and a CT head was normal. MRI was not performed. In November she had syncope preceded by protracted dizziness and "vision going black", fall without injury and quick recovery, followed by unilateral headache and jaw/chest discomfort. She did not activate her loop recorder on either occasion (again no arrhythmic events recorded).  No palpitations. Known PFO (confirmed by saline contrast injection; incidental?). At age 42 had neurological event, some MRI abnormalities that resolved and diagnosed as possible multiple sclerosis versus atypical migraine. Remote diagnosis of mitral valve prolapse not confirmed by 2014 echo.    Allergies  Allergen Reactions  . Sulfa Antibiotics Hives  . Sunscreens Hives    Current Outpatient Prescriptions  Medication Sig Dispense Refill  . estradiol (ESTRACE) 2 MG tablet Take 2 mg by mouth every morning.     Marland Kitchen levothyroxine (SYNTHROID, LEVOTHROID) 75 MCG tablet Take 75 mcg by mouth every morning.      No current facility-administered medications for this visit.    Past Medical History  Diagnosis Date  . Hypothyroid   . PFO (patent foramen ovale)   . Palpitations   . Chest pain     Past Surgical History  Procedure Laterality Date  . Abdominal hysterectomy  29 yrs olds  . Kidney surgery  2003    to fix blockage in uretra  . Lymph node biopsy  2001  . Breast implant exchange  2008  . Tonsillectomy  as a child  . Elbow surgery Left 2010  . Loop recorder implant N/A 07/27/2013    Procedure: LOOP RECORDER IMPLANT;  Surgeon: Thurmon Fair, MD;  Location: [facility]ATH [facility];  Service: Cardiovascular;  Laterality: N/A;     Family History  Problem Relation Age of Onset  . Cancer Mother   . High blood pressure Mother   . Hyperlipidemia Mother   . Lupus Mother   . Hypertension Father   . Hyperlipidemia Father     History   Social History  . Marital Status: Married    Spouse Name: N/A    Number of Children: N/A  . Years of Education: N/A   Occupational History  . Not on file.   Social History Main Topics  . Smoking status: Never Smoker   . Smokeless tobacco: Never Used  . Alcohol Use: 1.0 - 1.5 oz/week    2-3 drink(s) per week  . Drug Use: No  . Sexual Activity: Not on file   Other Topics Concern  . Not on file   Social History Narrative    Review of systems: The patient specifically denies any chest pain at rest or with exertion, dyspnea at rest or with exertion, orthopnea, paroxysmal nocturnal dyspnea, syncope, palpitations, focal neurological deficits, intermittent claudication, lower extremity edema, unexplained weight gain, cough, hemoptysis or wheezing.  The patient also denies abdominal pain, nausea, vomiting, dysphagia, diarrhea, constipation, polyuria, polydipsia, dysuria, hematuria, frequency, urgency, abnormal bleeding or bruising, fever, chills, unexpected weight changes, mood swings, change in skin or hair texture, change in voice quality, auditory or visual problems, allergic reactions or rashes, new musculoskeletal complaints other than usual "aches and pains".   PHYSICAL EXAM BP 117/82  mmHg  Pulse 63  Ht 5\' 3"  (1.6 m)  Wt 51.801 kg (114 lb 3.2 oz)  BMI 20.23 kg/m2  General: Alert, oriented x3, no distress Head: no evidence of trauma, PERRL, EOMI, no exophtalmos or lid lag, no myxedema, no xanthelasma; normal ears, nose and oropharynx Neck: normal jugular venous pulsations and no hepatojugular reflux; brisk carotid pulses without delay and no carotid bruits Chest: clear to auscultation, no signs of consolidation by percussion or palpation, normal fremitus, symmetrical  and full respiratory excursions Cardiovascular: normal position and quality of the apical impulse, regular rhythm, normal first and second heart sounds, no murmurs, rubs or gallops Abdomen: no tenderness or distention, no masses by palpation, no abnormal pulsatility or arterial bruits, normal bowel sounds, no hepatosplenomegaly Extremities: no clubbing, cyanosis or edema; 2+ radial, ulnar and brachial pulses bilaterally; 2+ right femoral, posterior tibial and dorsalis pedis pulses; 2+ left femoral, posterior tibial and dorsalis pedis pulses; no subclavian or femoral bruits Neurological: grossly nonfocal   ECG: Normal, NSR  Lipid Panel     Component Value Date/Time   CHOL 191 07/20/2013 0957   TRIG 100 07/20/2013 0957   HDL 65 07/20/2013 0957   CHOLHDL 2.9 07/20/2013 0957   VLDL 20 07/20/2013 0957   LDLCALC 106* 07/20/2013 0957    BMET    Component Value Date/Time   NA 138 07/18/2014 0815   K 3.6* 07/18/2014 0815   CL 99 07/18/2014 0815   CO2 23 07/18/2014 0815   GLUCOSE 107* 07/18/2014 0815   BUN 19 07/18/2014 0815   CREATININE 0.67 07/18/2014 0815   CREATININE 0.77 07/19/2013 1553   CALCIUM 8.5 07/18/2014 0815   GFRNONAA >90 07/18/2014 0815   GFRAA >90 07/18/2014 0815     ASSESSMENT AND PLAN  The mechanism of her symptoms is not clear, but the loop recorder shows no arrhythmia. Her syncope could be neurally mediated or an atypical migraine. Encouraged her to use the symptom activated recorder feature of her device (until we have such a recording, advised that we leave the device in place). The tenuous connections between PFO and migraine and PFO and strokes was reviewed, but statistically her PFO is an incidental finding. The use of OCP should also be considered in this mix.  Orders Placed This Encounter  Procedures  . Implantable device check   No orders of the defined types were placed in this encounter.    Junious SilkROITORU,Christan Defranco  Mills Mitton, MD, [facility] [phone] office [phone] pager

## 2015-01-14 NOTE — Patient Instructions (Signed)
Dr. Croitoru recommends that you schedule a follow-up appointment in: One year.   

## 2015-01-16 NOTE — Progress Notes (Signed)
Loop recorder 

## 2015-02-07 ENCOUNTER — Encounter: Payer: Self-pay | Admitting: Cardiovascular Disease

## 2015-02-11 ENCOUNTER — Ambulatory Visit (INDEPENDENT_AMBULATORY_CARE_PROVIDER_SITE_OTHER): Payer: 59 | Admitting: *Deleted

## 2015-02-11 DIAGNOSIS — R002 Palpitations: Secondary | ICD-10-CM

## 2015-02-11 NOTE — Progress Notes (Signed)
Loop recorder 

## 2015-02-26 LAB — MDC_IDC_ENUM_SESS_TYPE_REMOTE

## 2015-03-03 ENCOUNTER — Encounter (HOSPITAL_COMMUNITY)
Admission: RE | Admit: 2015-03-03 | Discharge: 2015-03-03 | Disposition: A | Discharge status: Home / Self Care | Payer: 59 | Source: Ambulatory Visit | Attending: Obstetrics & Gynecology | Admitting: Obstetrics & Gynecology

## 2015-03-03 ENCOUNTER — Other Ambulatory Visit: Payer: Self-pay | Admitting: Obstetrics & Gynecology

## 2015-03-03 ENCOUNTER — Encounter (HOSPITAL_COMMUNITY): Payer: Self-pay

## 2015-03-03 DIAGNOSIS — Z01812 Encounter for preprocedural laboratory examination: Secondary | ICD-10-CM | POA: Insufficient documentation

## 2015-03-03 HISTORY — DX: Headache, unspecified: R51.9

## 2015-03-03 HISTORY — DX: Headache: R51

## 2015-03-03 LAB — BASIC METABOLIC PANEL
Anion gap: 10 (ref 5–15)
BUN: 21 mg/dL (ref 6–23)
CALCIUM: 8.1 mg/dL — AB (ref 8.4–10.5)
CO2: 25 mmol/L (ref 19–32)
Chloride: 103 mmol/L (ref 96–112)
Creatinine, Ser: 0.72 mg/dL (ref 0.50–1.10)
GFR calc Af Amer: >90 mL/min (ref >90)
Glucose, Bld: 96 mg/dL (ref 70–99)
Potassium: 3.8 mmol/L (ref 3.5–5.1)
Sodium: 138 mmol/L (ref 135–145)

## 2015-03-03 LAB — CBC
HCT: 37.3 % (ref 36.0–46.0)
HEMOGLOBIN: 12.7 g/dL (ref 12.0–15.0)
MCH: 28.9 pg (ref 26.0–34.0)
MCHC: 34 g/dL (ref 30.0–36.0)
MCV: 85 fL (ref 78.0–100.0)
PLATELETS: 236 10*3/uL (ref 150–400)
RBC: 4.39 MIL/uL (ref 3.87–5.11)
RDW: 12.6 % (ref 11.5–15.5)
WBC: 5.7 10*3/uL (ref 4.0–10.5)

## 2015-03-03 NOTE — Patient Instructions (Addendum)
   Your procedure is scheduled on:  Thursday, March 24  [facility] at: 12 Noon Pick up the phone at the desk and dial [phone] and inform us of your arrival.  Please call this number if you have any problems the morning of surgery: [phone]  Remember: Do not eat food after midnight: Wednesday Do not drink clear liquids after: 9:30 AM Thursday, day of surgery Take these medicines the morning of surgery with a SIP OF WATER:  Synthroid  Do not wear jewelry, make-up, or FINGER nail polish No metal in your hair or on your body. Do not wear lotions, powders, perfumes.  You may wear deodorant.  Do not bring valuables to the hospital. Contacts, dentures or bridgework may not be worn into surgery.  Patients discharged on the day of surgery will not be allowed to drive home.  Home with Delia ChimesLee Reavis cell [phone]

## 2015-03-10 ENCOUNTER — Encounter: Payer: Self-pay | Admitting: Cardiovascular Disease

## 2015-03-12 MED ORDER — CEFAZOLIN SODIUM-DEXTROSE 2-3 GM-% IV SOLR
2.0000 g | INTRAVENOUS | Status: AC
  Administered 2015-03-13: 2 g via INTRAVENOUS

## 2015-03-12 NOTE — Anesthesia Preprocedure Evaluation (Addendum)
Anesthesia Evaluation  Patient identified by MRN, date of birth, ID band Patient awake    Reviewed: Allergy & Precautions, NPO status , Patient's Chart, lab work & pertinent test results  History of Anesthesia Complications Negative for: history of anesthetic complications  Airway Mallampati: II  TM Distance: >3 FB Neck ROM: Full    Dental no notable dental hx. (+) Dental Advisory Given, Teeth Intact   Pulmonary neg pulmonary ROS,  breath sounds clear to auscultation  Pulmonary exam normal       Cardiovascular Rhythm:Regular Rate:Normal  Hx of palpitations and chest pain. Worked up by Cardiology and implantable loop recorder has not picked up on any arrhythmia. Normal ECHO in 2014. PFO.    Neuro/Psych  Headaches, negative psych ROS   GI/Hepatic negative GI ROS, Neg liver ROS,   Endo/Other  Hypothyroidism   Renal/GU negative Renal ROS  negative genitourinary   Musculoskeletal negative musculoskeletal ROS (+)   Abdominal Normal abdominal exam  (+)   Peds negative pediatric ROS (+)  Hematology negative hematology ROS (+)   Anesthesia Other Findings   Reproductive/Obstetrics negative OB ROS                            Anesthesia Physical Anesthesia Plan  ASA: II  Anesthesia Plan: General   Post-op Pain Management:    Induction: Intravenous  Airway Management Planned: LMA  Additional Equipment:   Intra-op Plan:   Post-operative Plan: Extubation in OR  Informed Consent: I have reviewed the patients History and Physical, chart, labs and discussed the procedure including the risks, benefits and alternatives for the proposed anesthesia with the patient or authorized representative who has indicated his/her understanding and acceptance.   Dental advisory given  Plan Discussed with: CRNA, Anesthesiologist and Surgeon  Anesthesia Plan Comments:        Anesthesia Quick  Evaluation

## 2015-03-13 ENCOUNTER — Encounter (HOSPITAL_COMMUNITY)
Admission: RE | Disposition: A | Discharge status: Home / Self Care | Payer: Self-pay | Source: Ambulatory Visit | Attending: Obstetrics & Gynecology

## 2015-03-13 ENCOUNTER — Ambulatory Visit (INDEPENDENT_AMBULATORY_CARE_PROVIDER_SITE_OTHER): Payer: 59 | Admitting: *Deleted

## 2015-03-13 ENCOUNTER — Ambulatory Visit (HOSPITAL_COMMUNITY): Payer: 59 | Admitting: Anesthesiology

## 2015-03-13 ENCOUNTER — Ambulatory Visit (HOSPITAL_COMMUNITY)
Admission: RE | Admit: 2015-03-13 | Discharge: 2015-03-13 | Disposition: A | Discharge status: Home / Self Care | Payer: 59 | Source: Ambulatory Visit | Attending: Obstetrics & Gynecology | Admitting: Obstetrics & Gynecology

## 2015-03-13 ENCOUNTER — Encounter (HOSPITAL_COMMUNITY): Payer: Self-pay | Admitting: Anesthesiology

## 2015-03-13 DIAGNOSIS — Z87440 Personal history of urinary (tract) infections: Secondary | ICD-10-CM | POA: Insufficient documentation

## 2015-03-13 DIAGNOSIS — Z79890 Hormone replacement therapy (postmenopausal): Secondary | ICD-10-CM | POA: Diagnosis not present

## 2015-03-13 DIAGNOSIS — Z90722 Acquired absence of ovaries, bilateral: Secondary | ICD-10-CM | POA: Diagnosis not present

## 2015-03-13 DIAGNOSIS — N906 Hypertrophy of vulva: Secondary | ICD-10-CM | POA: Diagnosis present

## 2015-03-13 DIAGNOSIS — N9089 Other specified noninflammatory disorders of vulva and perineum: Secondary | ICD-10-CM | POA: Diagnosis not present

## 2015-03-13 DIAGNOSIS — Z90710 Acquired absence of both cervix and uterus: Secondary | ICD-10-CM | POA: Diagnosis not present

## 2015-03-13 DIAGNOSIS — R002 Palpitations: Secondary | ICD-10-CM

## 2015-03-13 DIAGNOSIS — E890 Postprocedural hypothyroidism: Secondary | ICD-10-CM | POA: Diagnosis not present

## 2015-03-13 DIAGNOSIS — Z79899 Other long term (current) drug therapy: Secondary | ICD-10-CM | POA: Diagnosis not present

## 2015-03-13 HISTORY — PX: VULVECTOMY PARTIAL: SHX6187

## 2015-03-13 LAB — MDC_IDC_ENUM_SESS_TYPE_REMOTE

## 2015-03-13 SURGERY — VULVECTOMY, PARTIAL
Anesthesia: General | Site: Vagina | Laterality: Bilateral

## 2015-03-13 MED ORDER — FENTANYL CITRATE 0.05 MG/ML IJ SOLN
INTRAMUSCULAR | Status: DC | PRN
  Administered 2015-03-13: 25 ug via INTRAVENOUS
  Administered 2015-03-13: 50 ug via INTRAVENOUS

## 2015-03-13 MED ORDER — SCOPOLAMINE 1 MG/3DAYS TD PT72
1.0000 | MEDICATED_PATCH | Freq: Once | TRANSDERMAL | Status: DC
  Administered 2015-03-13: 1.5 mg via TRANSDERMAL

## 2015-03-13 MED ORDER — LACTATED RINGERS IV SOLN
INTRAVENOUS | Status: DC
  Administered 2015-03-13 (×2): via INTRAVENOUS

## 2015-03-13 MED ORDER — DEXAMETHASONE SODIUM PHOSPHATE 4 MG/ML IJ SOLN
INTRAMUSCULAR | Status: DC | PRN
  Administered 2015-03-13: 4 mg via INTRAVENOUS

## 2015-03-13 MED ORDER — SCOPOLAMINE 1 MG/3DAYS TD PT72
MEDICATED_PATCH | TRANSDERMAL | Status: AC
  Filled 2015-03-13: qty 1

## 2015-03-13 MED ORDER — ONDANSETRON HCL 4 MG/2ML IJ SOLN
INTRAMUSCULAR | Status: AC
  Filled 2015-03-13: qty 2

## 2015-03-13 MED ORDER — LIDOCAINE HCL (CARDIAC) 20 MG/ML IV SOLN
INTRAVENOUS | Status: DC | PRN
  Administered 2015-03-13: 60 mg via INTRAVENOUS

## 2015-03-13 MED ORDER — BUPIVACAINE HCL (PF) 0.25 % IJ SOLN
INTRAMUSCULAR | Status: DC | PRN
  Administered 2015-03-13: 10 mL

## 2015-03-13 MED ORDER — CEFAZOLIN SODIUM-DEXTROSE 2-3 GM-% IV SOLR
INTRAVENOUS | Status: AC
  Filled 2015-03-13: qty 50

## 2015-03-13 MED ORDER — OXYCODONE-ACETAMINOPHEN 5-325 MG PO TABS: 1.0000 | ORAL_TABLET | ORAL | Status: DC | PRN

## 2015-03-13 MED ORDER — LIDOCAINE-EPINEPHRINE 1 %-1:100000 IJ SOLN
INTRAMUSCULAR | Status: AC
  Filled 2015-03-13: qty 1

## 2015-03-13 MED ORDER — PROPOFOL 10 MG/ML IV BOLUS
INTRAVENOUS | Status: DC | PRN
  Administered 2015-03-13: 50 mg via INTRAVENOUS
  Administered 2015-03-13: 150 mg via INTRAVENOUS

## 2015-03-13 MED ORDER — KETOROLAC TROMETHAMINE 30 MG/ML IJ SOLN
INTRAMUSCULAR | Status: AC
  Administered 2015-03-13: 30 mg via INTRAVENOUS
  Filled 2015-03-13: qty 1

## 2015-03-13 MED ORDER — KETOROLAC TROMETHAMINE 30 MG/ML IJ SOLN
30.0000 mg | Freq: Four times a day (QID) | INTRAMUSCULAR | Status: DC
Start: 2015-03-13 — End: 2015-03-13
  Administered 2015-03-13: 30 mg via INTRAVENOUS

## 2015-03-13 MED ORDER — PROPOFOL 10 MG/ML IV BOLUS
INTRAVENOUS | Status: AC
  Filled 2015-03-13: qty 20

## 2015-03-13 MED ORDER — FENTANYL CITRATE 0.05 MG/ML IJ SOLN
INTRAMUSCULAR | Status: AC
  Filled 2015-03-13: qty 2

## 2015-03-13 MED ORDER — MIDAZOLAM HCL 2 MG/2ML IJ SOLN
INTRAMUSCULAR | Status: DC | PRN
Start: 2015-03-13 — End: 2015-03-13
  Administered 2015-03-13: 2 mg via INTRAVENOUS

## 2015-03-13 MED ORDER — ONDANSETRON HCL 4 MG/2ML IJ SOLN: 4.0000 mg | Freq: Once | INTRAMUSCULAR | Status: DC | PRN

## 2015-03-13 MED ORDER — MIDAZOLAM HCL 2 MG/2ML IJ SOLN
INTRAMUSCULAR | Status: AC
  Filled 2015-03-13: qty 2

## 2015-03-13 MED ORDER — DEXAMETHASONE SODIUM PHOSPHATE 10 MG/ML IJ SOLN
INTRAMUSCULAR | Status: AC
  Filled 2015-03-13: qty 1

## 2015-03-13 MED ORDER — ONDANSETRON HCL 4 MG/2ML IJ SOLN
INTRAMUSCULAR | Status: DC | PRN
  Administered 2015-03-13: 4 mg via INTRAVENOUS

## 2015-03-13 MED ORDER — FENTANYL CITRATE 0.05 MG/ML IJ SOLN: 25.0000 ug | INTRAMUSCULAR | Status: DC | PRN

## 2015-03-13 MED ORDER — IBUPROFEN 600 MG PO TABS: 600.0000 mg | ORAL_TABLET | Freq: Four times a day (QID) | ORAL | Status: DC | PRN

## 2015-03-13 MED ORDER — BUPIVACAINE HCL (PF) 0.25 % IJ SOLN
INTRAMUSCULAR | Status: AC
  Filled 2015-03-13: qty 10

## 2015-03-13 MED ORDER — BUPIVACAINE LIPOSOME 1.3 % IJ SUSP
20.0000 mL | Freq: Once | INTRAMUSCULAR | Status: AC
  Administered 2015-03-13: 20 mL
  Filled 2015-03-13: qty 20

## 2015-03-13 MED ORDER — LIDOCAINE HCL (CARDIAC) 20 MG/ML IV SOLN
INTRAVENOUS | Status: AC
  Filled 2015-03-13: qty 5

## 2015-03-13 SURGICAL SUPPLY — 19 items
BLADE SURG 15 STRL LF C SS BP (BLADE) IMPLANT
BLADE SURG 15 STRL SS (BLADE) ×4
CLOTH BEACON ORANGE TIMEOUT ST (SAFETY) ×2 IMPLANT
CONTAINER PREFILL 10% NBF 15ML (MISCELLANEOUS) ×2 IMPLANT
COUNTER NEEDLE 1200 MAGNETIC (NEEDLE) ×1 IMPLANT
ELECT REM PT RETURN 9FT ADLT (ELECTROSURGICAL) ×2
ELECTRODE REM PT RTRN 9FT ADLT (ELECTROSURGICAL) IMPLANT
GLOVE BIO SURGEON STRL SZ7 (GLOVE) ×2 IMPLANT
GLOVE BIOGEL PI IND STRL 7.0 (GLOVE) ×1 IMPLANT
GLOVE BIOGEL PI INDICATOR 7.0 (GLOVE) ×2
GOWN STRL REUS W/TWL LRG LVL3 (GOWN DISPOSABLE) ×4 IMPLANT
NS IRRIG 1000ML POUR BTL (IV SOLUTION) ×2 IMPLANT
PACK VAGINAL MINOR WOMEN LF (CUSTOM PROCEDURE TRAY) ×2 IMPLANT
PAD OB MATERNITY 4.3X12.25 (PERSONAL CARE ITEMS) ×2 IMPLANT
PAD PREP 24X48 CUFFED NSTRL (MISCELLANEOUS) ×2 IMPLANT
PENCIL BUTTON HOLSTER BLD 10FT (ELECTRODE) ×1 IMPLANT
SUT MNCRL AB 3-0 PS2 27 (SUTURE) ×1 IMPLANT
TOWEL OR 17X24 6PK STRL BLUE (TOWEL DISPOSABLE) ×4 IMPLANT
WATER STERILE IRR 1000ML POUR (IV SOLUTION) ×2 IMPLANT

## 2015-03-13 NOTE — Anesthesia Postprocedure Evaluation (Signed)
  Anesthesia Post-op Note  Patient: Cheryl Irwin  Procedure(s) Performed: Procedure(s): VULVECTOMY PARTIAL (Bilateral)  Patient Location: PACU  Anesthesia Type:General  Level of Consciousness: awake, alert  and oriented  Airway and Oxygen Therapy: Patient Spontanous Breathing  Post-op Pain: none  Post-op Assessment: Post-op Vital signs reviewed, Patient's Cardiovascular Status Stable, Respiratory Function Stable, Patent Airway, No signs of Nausea or vomiting and Pain level controlled  Post-op Vital Signs: Reviewed and stable  Last Vitals:  Filed Vitals:   03/13/15 1600  BP: 108/75  Pulse: 60  Temp:   Resp: 14    Complications: No apparent anesthesia complications

## 2015-03-13 NOTE — Discharge Instructions (Signed)

## 2015-03-13 NOTE — Op Note (Signed)
Preoperative diagnosis: Bilateral labial hypertrophy and pain Postop diagnosis: as above.  Procedure: Bilateral partial labial excision and removal of perineal skin tag Anesthesia General via LMA, local anesthesia 0.25% Bupivacaine and Exparel (10:20 cc combination).   Surgeon: Shea EvansVaishali Junia Nygren, MD  Assistant: none IV fluids LR 1100 cc Estimated blood loss 30 cc Urine output: n/a Complications none  Condition stable  Disposition PACU  Specimen: none   Procedure  Indication: Bilateral labia majora hypertrophy with pain, discomfort, recurrent infections, desired excision. Procedure and risks/ complications reviewed, patient gave informed written consent.  Patient was brought to the operating room with IV running. Time out was carried out. She received preop 1 gm Ancef. She underwent general anesthesia via LMA without complications. She was given dorsolithotomy position. Parts were prepped and draped in standard fashion.  Both labia evaluated and marked for excision to keep both comparable. 15 # blade used to excise right excessive labia and bleeding cauterized. 3-0 Monocryl suture used for subcuticular closure. Above mentioned local anesthesia infiltrated at the surgical site. Now left excessive labia excised with blade, cauterized and sutured and local anesthesia combination infiltrated, using total 30 cc. Small perineal skin tag excised with blade and was small base, hemostatic. No specimen. Patient tolerated procedure well. All counts are correct x2. No complications.  Patient brought to the PACU in stable condition   V.Juliene PinaMody, MD.

## 2015-03-13 NOTE — Anesthesia Procedure Notes (Signed)
Procedure Name: LMA Insertion Date/Time: 03/13/2015 1:56 PM Performed by: Yolonda KidaARVER, Cameryn Schum L Pre-anesthesia Checklist: Patient identified, Emergency Drugs available, Suction available and Patient being monitored Patient Re-evaluated:Patient Re-evaluated prior to inductionOxygen Delivery Method: Circle system utilized Preoxygenation: Pre-oxygenation with 100% oxygen Intubation Type: IV induction LMA: LMA inserted LMA Size: 4.0 Number of attempts: 1 Placement Confirmation: positive ETCO2,  CO2 detector and breath sounds checked- equal and bilateral Tube secured with: Tape Dental Injury: Teeth and Oropharynx as per pre-operative assessment

## 2015-03-13 NOTE — H&P (Signed)
[name] is an 49 y.o. female.who is here for labial excision due to enlarged labia getting pulled in underpants and causing recurrent infections after coitus. H/o recurrent UTIs after coitus.  S/p hysterectomy/ BSO and is on oral ERT. On Synthroid since thyroidectomy.  No LMP recorded. Patient has had a hysterectomy.    Past Medical History  Diagnosis Date  . Hypothyroid   . PFO (patent foramen ovale)   . Palpitations 07/2013    Loop Recorder Implanted  . Chest pain   . SVD (spontaneous vaginal delivery)     x 2  . Headache     otc med prn    Past Surgical History  Procedure Laterality Date  . Abdominal hysterectomy  29 yrs olds  . Kidney surgery  2003    to fix blockage in uretra  . Lymph node biopsy  2001  . Breast implant exchange  2008  . Tonsillectomy  as a child  . Elbow surgery Left 2010  . Loop recorder implant N/A 07/27/2013    Procedure: LOOP RECORDER IMPLANT;  Surgeon: Thurmon FairMihai Croitoru, MD;  Location: [facility];  Service: Cardiovascular;  Laterality: N/A;  . Thyroidectomy    . Parathyroidectomy      partial  . Wisdom tooth extraction    . Breast surgery      augmentation, new implants  . Kidney stone surgery      mutiple    Family History  Problem Relation Age of Onset  . Cancer Mother   . High blood pressure Mother   . Hyperlipidemia Mother   . Lupus Mother   . Hypertension Father   . Hyperlipidemia Father     Social History:  reports that she has never smoked. She has never used smokeless tobacco. She reports that she drinks about 1.0 - 1.5 oz of alcohol per week. She reports that she does not use illicit drugs.  Allergies:  Allergies  Allergen Reactions  . Sulfa Antibiotics Hives  . Sunscreens Hives    Prescriptions prior to admission  Medication Sig Dispense Refill Last Dose  . estradiol (ESTRACE) 2 MG tablet Take 2 mg by mouth every morning.    03/12/2015 at Unknown time  . levothyroxine (SYNTHROID, LEVOTHROID) 75 MCG tablet Take 75  mcg by mouth every morning.    03/13/2015 at Unknown time  . Multiple Vitamin (MULTIVITAMIN WITH MINERALS) TABS tablet Take 1 tablet by mouth daily.       ROS neg   Blood pressure 105/75, pulse 82, temperature 97.9 F (36.6 C), temperature source Oral, resp. rate 18, SpO2 99 %. Physical Exam A&O x 3, no acute distress. Pleasant HEENT neg, no thyromegaly Lungs CTA bilat CV RRR, S1S2 normal Abdo soft, non tender, non acute Extr no edema/ tenderness Pelvic- Bilateral enlarged labia minora   Assessment/Plan: Labial hypertrophy, recurrent infections and discomfort with clothing. Plan excision under anesthesia. Reviewed procedure, risks limited to bleeding, pain, poor healing due to pressure when sitting, unequal healing since stretchy skin and may contract down differently, refer to Plastic surgery option and patient understands and agrees.    Arien Benincasa R 03/13/2015, 12:46 PM

## 2015-03-13 NOTE — Transfer of Care (Signed)
Immediate Anesthesia Transfer of Care Note  Patient: Cheryl Irwin  Procedure(s) Performed: Procedure(s): VULVECTOMY PARTIAL (Bilateral)  Patient Location: PACU  Anesthesia Type:General  Level of Consciousness: awake, alert  and oriented  Airway & Oxygen Therapy: Patient Spontanous Breathing and Patient connected to nasal cannula oxygen  Post-op Assessment: Report given to RN and Post -op Vital signs reviewed and stable  Post vital signs: Reviewed and stable  Last Vitals:  Filed Vitals:   03/13/15 1200  BP: 105/75  Pulse: 82  Temp: 36.6 C  Resp: 18    Complications: No apparent anesthesia complications

## 2015-03-14 ENCOUNTER — Encounter (HOSPITAL_COMMUNITY): Payer: Self-pay | Admitting: Obstetrics & Gynecology

## 2015-03-18 NOTE — Progress Notes (Signed)
Loop recorder 

## 2015-04-11 ENCOUNTER — Ambulatory Visit (INDEPENDENT_AMBULATORY_CARE_PROVIDER_SITE_OTHER): Payer: 59 | Admitting: *Deleted

## 2015-04-11 DIAGNOSIS — R002 Palpitations: Secondary | ICD-10-CM

## 2015-04-15 ENCOUNTER — Encounter: Payer: Self-pay | Admitting: Cardiovascular Disease

## 2015-04-16 NOTE — Progress Notes (Signed)
Loop recorder 

## 2015-05-01 ENCOUNTER — Encounter: Payer: Self-pay | Admitting: Cardiology

## 2015-05-08 ENCOUNTER — Encounter: Payer: Self-pay | Admitting: Cardiology

## 2015-05-12 ENCOUNTER — Ambulatory Visit (INDEPENDENT_AMBULATORY_CARE_PROVIDER_SITE_OTHER): Payer: 59 | Admitting: *Deleted

## 2015-05-12 DIAGNOSIS — R002 Palpitations: Secondary | ICD-10-CM

## 2015-05-13 NOTE — Progress Notes (Signed)
Loop recorder 

## 2015-05-15 ENCOUNTER — Encounter: Payer: Self-pay | Admitting: Cardiology

## 2015-05-15 LAB — CUP PACEART REMOTE DEVICE CHECK: Date Time Interrogation Session: 20160526101823

## 2015-05-16 IMAGING — CT CT ABD-PELV W/O CM
2 of 4 series · 13 of 32 positions shown, 19 images · non-contrast
Comparison: 10/31/2009, [DATE]

CLINICAL DATA: Right flank pain, rule out stone, microhematuria

EXAM:
CT ABDOMEN AND PELVIS WITHOUT CONTRAST
TECHNIQUE: Multidetector CT imaging of the abdomen and pelvis was performed
following the standard protocol without intravenous contrast.

[Series 400: sag · sagittal · 0.79mm/px · 11 of 118 slices shown, 17 images]
[im 10/118  soft-tissue]
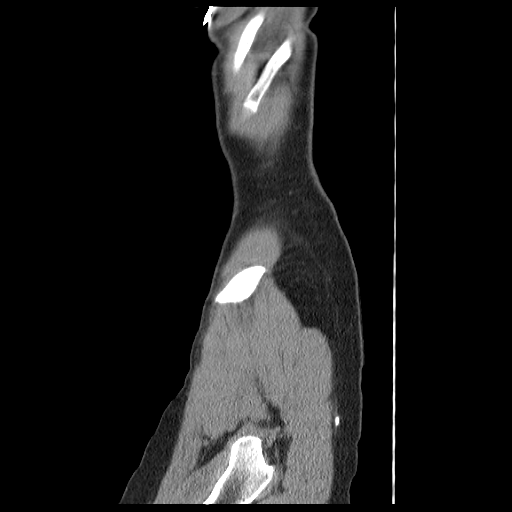
[im 10/118  lung]
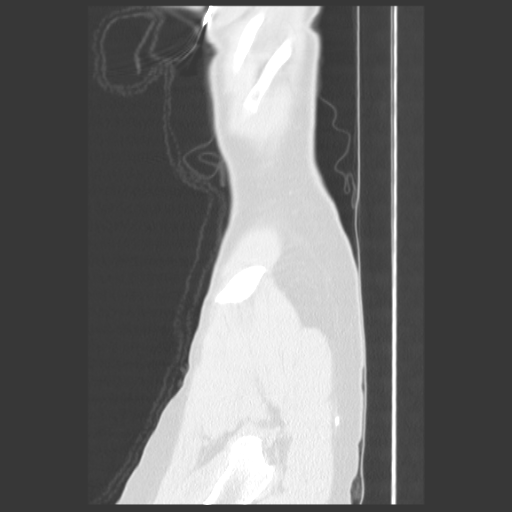
[im 10/118  bone]
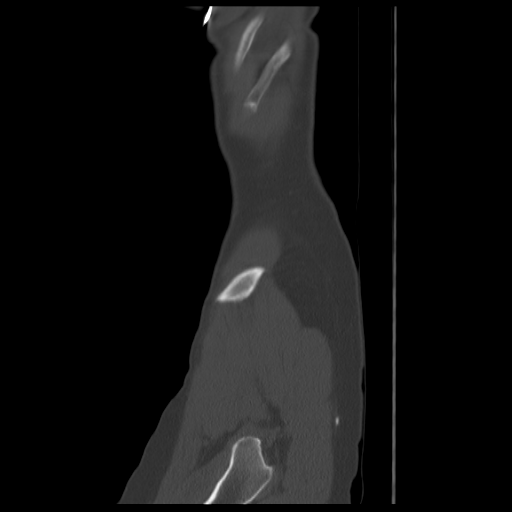
[im 20/118  soft-tissue]
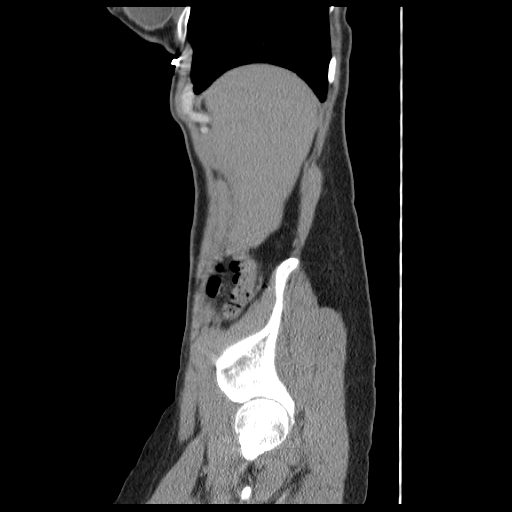
[im 20/118  lung]
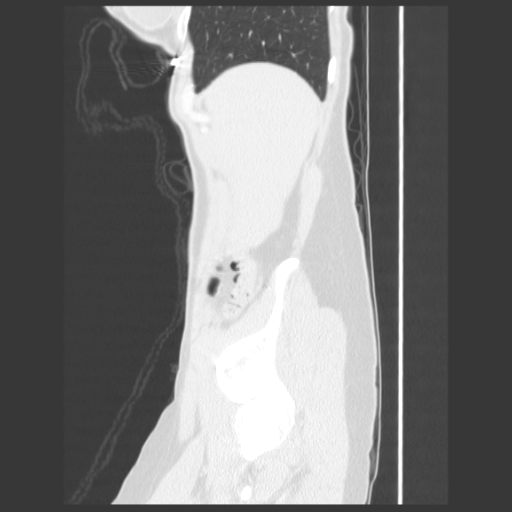
[im 30/118  soft-tissue]
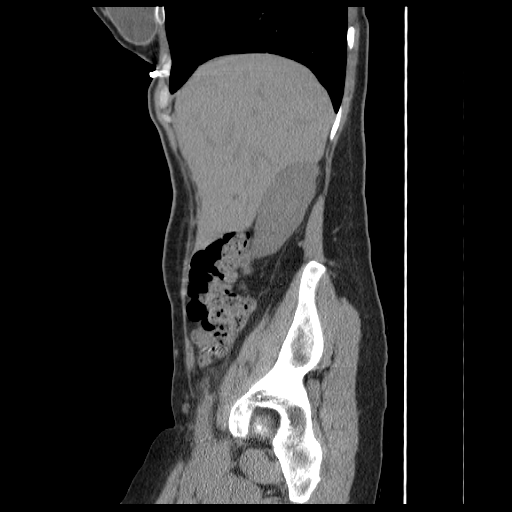
[im 30/118  lung]
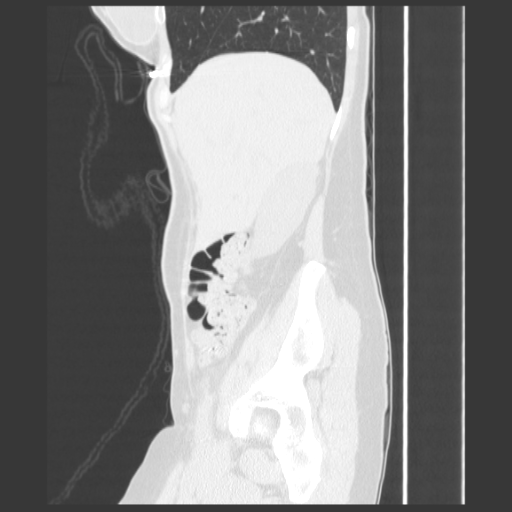
[im 40/118  soft-tissue]
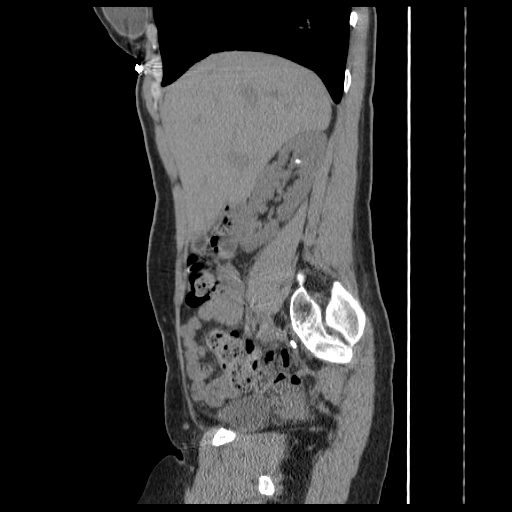
[im 40/118  lung]
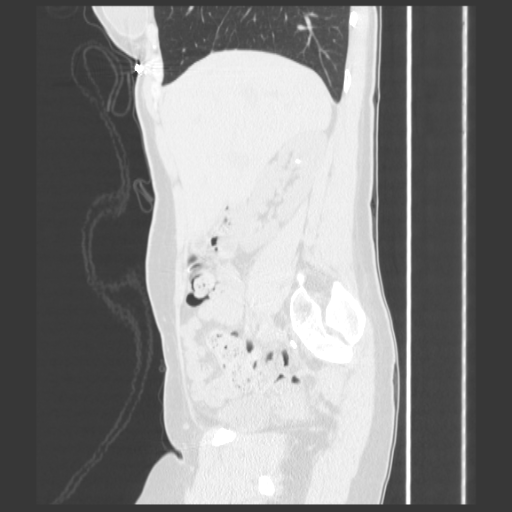
[im 49/118  soft-tissue]
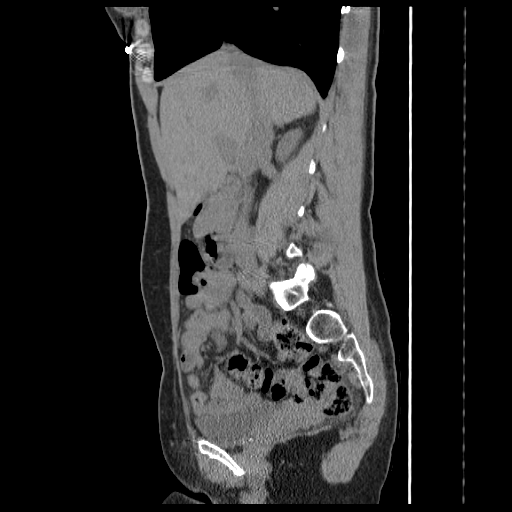
[im 59/118  soft-tissue]
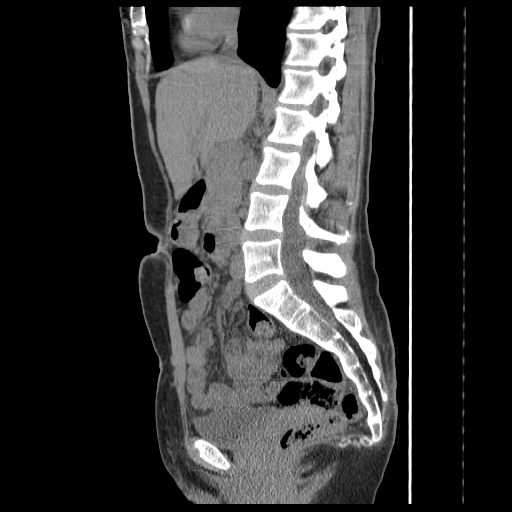
[im 69/118  soft-tissue]
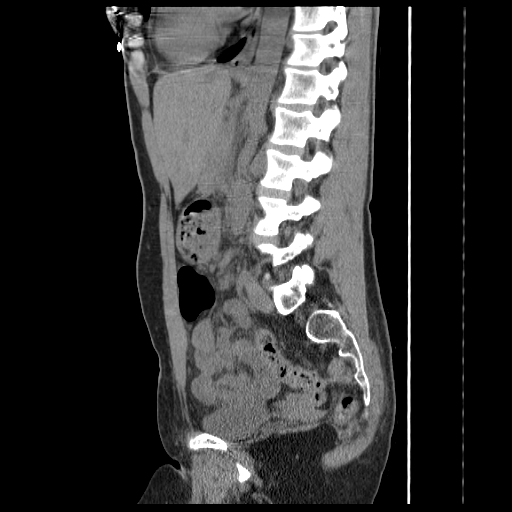
[im 79/118  soft-tissue]
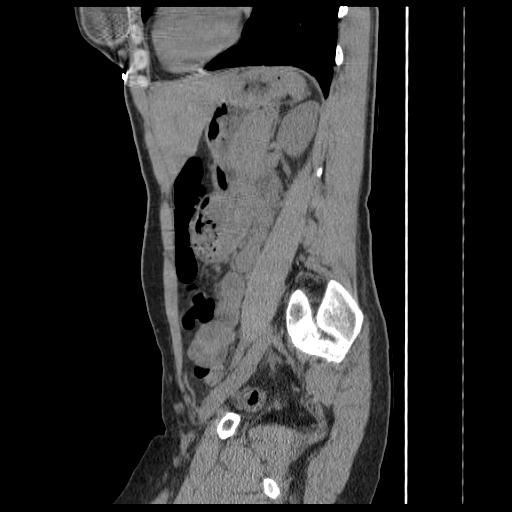
[im 88/118  soft-tissue]
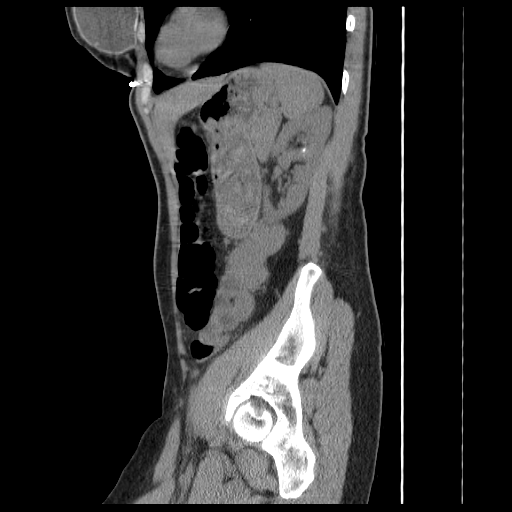
[im 88/118  bone]
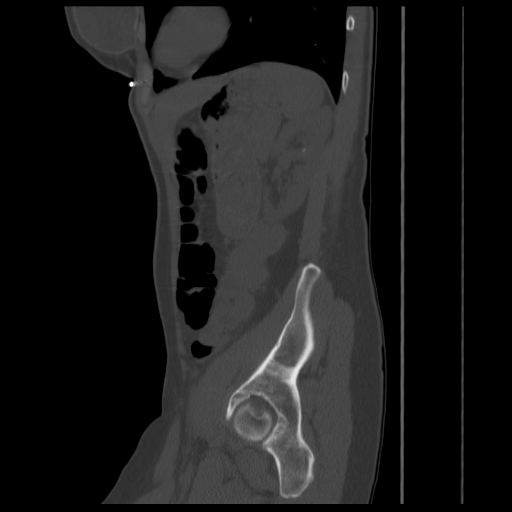
[im 98/118  soft-tissue]
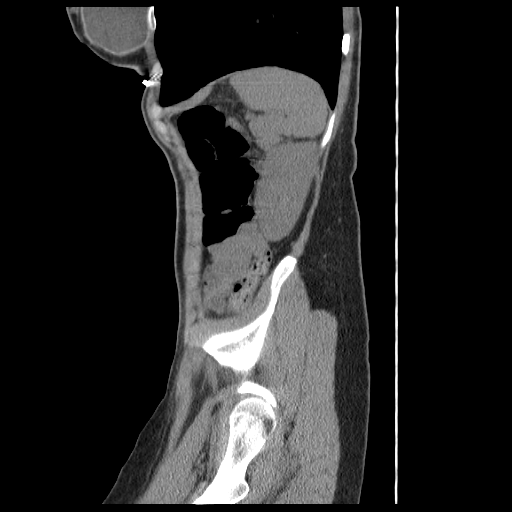
[im 108/118  soft-tissue]
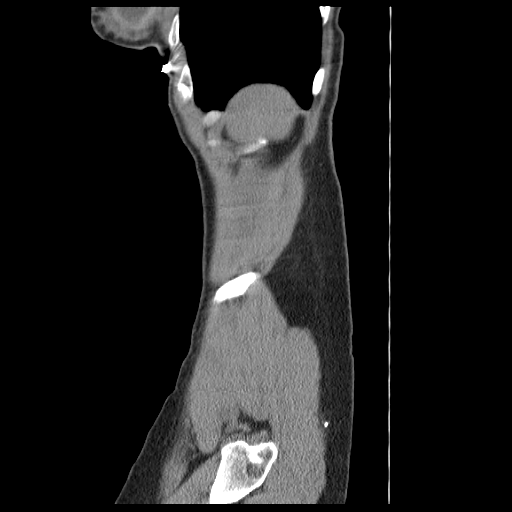

[Series 401: cor · coronal · 0.79mm/px · 2 of 78 slices shown]
[im 12/78  soft-tissue]
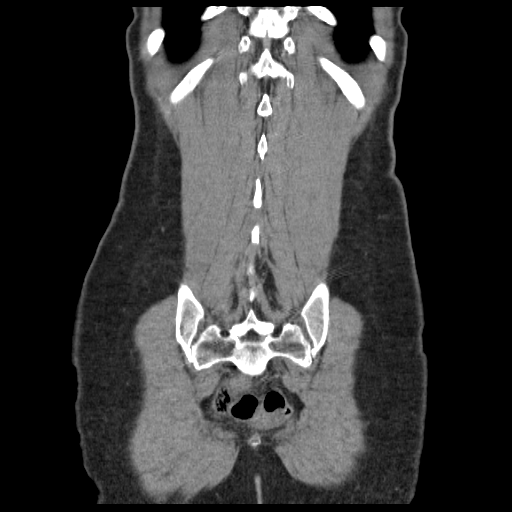
[im 23/78  soft-tissue]
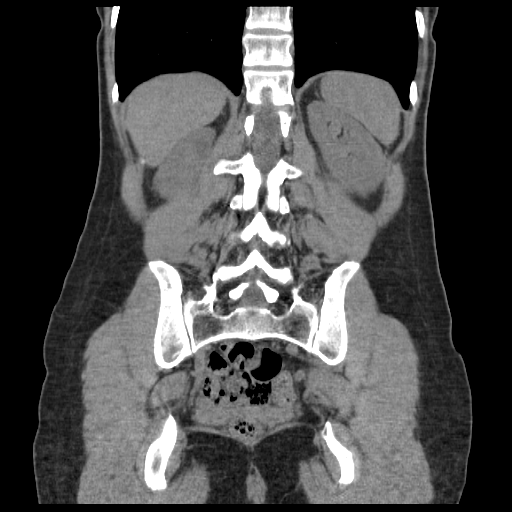

[13 of 32 positions shown; findings below may reference images not displayed]

FINDINGS: There are several tiny right renal calculi. These all measure 1-2
mm. No right-sided hydronephrosis identified. 2 cm upper pole right
renal cyst, not fully characterized without contrast, stable. On the
left, several tiny calculi measuring 1-2 mm present. Although there
is no significant hydronephrosis, there is a 2 mm stone in the
bladder to the left of midline near the left ureterovesical
junction.

Liver is normal. Gallbladder is contracted. Spleen and pancreas are
normal. Adrenal glands are normal. Bowel shows no significant
abnormalities. There are surgical clips in the retroperitoneum on
the right near the mid right ureter and in the right pericolic
gutter and right pelvic sidewall. Bladder is normal. Reproductive
organs show no significant abnormalities. Patient appears to be
status post hysterectomy. No ascites. No significant adenopathy. The
visualized portions of the lung bases are clear. No acute
musculoskeletal abnormalities.
IMPRESSION: Numerous tiny bilateral renal calculi without hydronephrosis. 2 mm
stone in the bladder to the left of midline just inside the left
ureterovesical tunnel.

## 2015-05-22 LAB — CUP PACEART REMOTE DEVICE CHECK
Date Time Interrogation Session: 20160526232300
MDC IDC SET ZONE DETECTION INTERVAL: 2000 ms
Zone Setting Detection Interval: 3000 ms
Zone Setting Detection Interval: 320 ms

## 2015-06-02 ENCOUNTER — Encounter: Payer: Self-pay | Admitting: Cardiovascular Disease

## 2015-06-04 ENCOUNTER — Encounter: Payer: Self-pay | Admitting: Cardiology

## 2015-06-11 ENCOUNTER — Ambulatory Visit (INDEPENDENT_AMBULATORY_CARE_PROVIDER_SITE_OTHER): Payer: 59 | Admitting: *Deleted

## 2015-06-11 ENCOUNTER — Encounter: Payer: Self-pay | Admitting: Cardiology

## 2015-06-11 DIAGNOSIS — R002 Palpitations: Secondary | ICD-10-CM

## 2015-06-11 NOTE — Progress Notes (Signed)
Loop recorder 

## 2015-06-13 ENCOUNTER — Encounter: Payer: Self-pay | Admitting: Cardiovascular Disease

## 2015-06-17 ENCOUNTER — Encounter: Payer: Self-pay | Admitting: Cardiology

## 2015-06-17 LAB — CUP PACEART REMOTE DEVICE CHECK: MDC IDC SESS DTM: 20160628095127

## 2015-07-02 ENCOUNTER — Encounter: Payer: Self-pay | Admitting: Cardiology

## 2015-07-09 ENCOUNTER — Encounter: Payer: Self-pay | Admitting: Cardiology

## 2015-07-11 ENCOUNTER — Ambulatory Visit (INDEPENDENT_AMBULATORY_CARE_PROVIDER_SITE_OTHER): Payer: 59

## 2015-07-11 DIAGNOSIS — R002 Palpitations: Secondary | ICD-10-CM

## 2015-07-13 ENCOUNTER — Encounter: Payer: Self-pay | Admitting: Cardiovascular Disease

## 2015-07-13 ENCOUNTER — Encounter (HOSPITAL_COMMUNITY): Payer: Self-pay

## 2015-07-13 ENCOUNTER — Emergency Department (HOSPITAL_COMMUNITY): Payer: 59

## 2015-07-13 ENCOUNTER — Telehealth: Payer: Self-pay | Admitting: Physician Assistant

## 2015-07-13 ENCOUNTER — Observation Stay (HOSPITAL_COMMUNITY)
Admission: EM | Admit: 2015-07-13 | Discharge: 2015-07-14 | Disposition: A | Discharge status: Home / Self Care | Payer: 59 | Attending: Cardiovascular Disease | Admitting: Cardiovascular Disease

## 2015-07-13 DIAGNOSIS — N200 Calculus of kidney: Secondary | ICD-10-CM | POA: Diagnosis not present

## 2015-07-13 DIAGNOSIS — E039 Hypothyroidism, unspecified: Secondary | ICD-10-CM | POA: Diagnosis not present

## 2015-07-13 DIAGNOSIS — Z79899 Other long term (current) drug therapy: Secondary | ICD-10-CM | POA: Insufficient documentation

## 2015-07-13 DIAGNOSIS — R002 Palpitations: Secondary | ICD-10-CM | POA: Diagnosis present

## 2015-07-13 DIAGNOSIS — R5383 Other fatigue: Secondary | ICD-10-CM | POA: Diagnosis not present

## 2015-07-13 DIAGNOSIS — Q6100 Congenital renal cyst, unspecified: Secondary | ICD-10-CM | POA: Insufficient documentation

## 2015-07-13 DIAGNOSIS — R531 Weakness: Secondary | ICD-10-CM | POA: Insufficient documentation

## 2015-07-13 DIAGNOSIS — R0789 Other chest pain: Secondary | ICD-10-CM | POA: Diagnosis not present

## 2015-07-13 DIAGNOSIS — I471 Supraventricular tachycardia: Secondary | ICD-10-CM | POA: Diagnosis not present

## 2015-07-13 DIAGNOSIS — Q211 Atrial septal defect: Secondary | ICD-10-CM | POA: Diagnosis not present

## 2015-07-13 DIAGNOSIS — R0602 Shortness of breath: Secondary | ICD-10-CM | POA: Insufficient documentation

## 2015-07-13 HISTORY — DX: Cyst of kidney, acquired: N28.1

## 2015-07-13 HISTORY — DX: Hypothyroidism, unspecified: E03.9

## 2015-07-13 HISTORY — DX: Calculus of kidney: N20.0

## 2015-07-13 HISTORY — DX: Injury of unspecified iliac blood vessel(s), initial encounter: S35.50XA

## 2015-07-13 HISTORY — DX: Other symptoms and signs involving the nervous system: R29.818

## 2015-07-13 LAB — BASIC METABOLIC PANEL
Anion gap: 12 (ref 5–15)
BUN: 23 mg/dL — ABNORMAL HIGH (ref 6–20)
CHLORIDE: 105 mmol/L (ref 101–111)
CO2: 23 mmol/L (ref 22–32)
Calcium: 8.6 mg/dL — ABNORMAL LOW (ref 8.9–10.3)
Creatinine, Ser: 0.83 mg/dL (ref 0.44–1.00)
GLUCOSE: 119 mg/dL — AB (ref 65–99)
POTASSIUM: 3.2 mmol/L — AB (ref 3.5–5.1)
SODIUM: 140 mmol/L (ref 135–145)

## 2015-07-13 LAB — CBC WITH DIFFERENTIAL/PLATELET
Basophils Absolute: 0 10*3/uL (ref 0.0–0.1)
Basophils Relative: 0 % (ref 0–1)
EOS ABS: 0 10*3/uL (ref 0.0–0.7)
Eosinophils Relative: 0 % (ref 0–5)
HCT: 34.8 % — ABNORMAL LOW (ref 36.0–46.0)
HEMOGLOBIN: 11.7 g/dL — AB (ref 12.0–15.0)
LYMPHS ABS: 1.2 10*3/uL (ref 0.7–4.0)
Lymphocytes Relative: 12 % (ref 12–46)
MCH: 28.6 pg (ref 26.0–34.0)
MCHC: 33.6 g/dL (ref 30.0–36.0)
MCV: 85.1 fL (ref 78.0–100.0)
Monocytes Absolute: 0.6 10*3/uL (ref 0.1–1.0)
Monocytes Relative: 6 % (ref 3–12)
NEUTROS ABS: 8.3 10*3/uL — AB (ref 1.7–7.7)
NEUTROS PCT: 82 % — AB (ref 43–77)
PLATELETS: 223 10*3/uL (ref 150–400)
RBC: 4.09 MIL/uL (ref 3.87–5.11)
RDW: 13.1 % (ref 11.5–15.5)
WBC: 10.1 10*3/uL (ref 4.0–10.5)

## 2015-07-13 LAB — I-STAT TROPONIN, ED: Troponin i, poc: 0.2 ng/mL (ref 0.00–0.08)

## 2015-07-13 LAB — D-DIMER, QUANTITATIVE: D-Dimer, Quant: 0.66 ug/mL-FEU — ABNORMAL HIGH (ref 0.00–0.48)

## 2015-07-13 LAB — TROPONIN I: Troponin I: 0.37 ng/mL — ABNORMAL HIGH (ref <0.031)

## 2015-07-13 LAB — MAGNESIUM: Magnesium: 1.9 mg/dL (ref 1.7–2.4)

## 2015-07-13 MED ORDER — LEVOTHYROXINE SODIUM 75 MCG PO TABS
75.0000 ug | ORAL_TABLET | Freq: Every day | ORAL | Status: DC
  Administered 2015-07-14: 75 ug via ORAL
  Filled 2015-07-13: qty 1

## 2015-07-13 MED ORDER — ACETAMINOPHEN 500 MG PO TABS
1000.0000 mg | ORAL_TABLET | Freq: Four times a day (QID) | ORAL | Status: DC | PRN
  Administered 2015-07-13: 1000 mg via ORAL
  Filled 2015-07-13: qty 2

## 2015-07-13 MED ORDER — IOHEXOL 350 MG/ML SOLN
80.0000 mL | Freq: Once | INTRAVENOUS | Status: AC | PRN
  Administered 2015-07-13: 80 mL via INTRAVENOUS

## 2015-07-13 MED ORDER — MORPHINE SULFATE 2 MG/ML IJ SOLN
2.0000 mg | INTRAMUSCULAR | Status: DC | PRN
Start: 2015-07-13 — End: 2015-07-14

## 2015-07-13 MED ORDER — SODIUM CHLORIDE 0.9 % IV SOLN
Freq: Once | INTRAVENOUS | Status: AC
  Administered 2015-07-13: 11:00:00 via INTRAVENOUS

## 2015-07-13 MED ORDER — ASPIRIN EC 81 MG PO TBEC: 162.0000 mg | DELAYED_RELEASE_TABLET | Freq: Once | ORAL | Status: AC | PRN

## 2015-07-13 MED ORDER — POTASSIUM CHLORIDE CRYS ER 20 MEQ PO TBCR
40.0000 meq | EXTENDED_RELEASE_TABLET | Freq: Once | ORAL | Status: AC
  Administered 2015-07-13: 40 meq via ORAL
  Filled 2015-07-13: qty 2

## 2015-07-13 MED ORDER — ESTRADIOL 2 MG PO TABS: 2.0000 mg | ORAL_TABLET | Freq: Every morning | ORAL | Status: DC

## 2015-07-13 MED ORDER — ADULT MULTIVITAMIN W/MINERALS CH
1.0000 | ORAL_TABLET | Freq: Every day | ORAL | Status: DC
  Administered 2015-07-13 – 2015-07-14 (×2): 1 via ORAL
  Filled 2015-07-13 (×2): qty 1

## 2015-07-13 MED ORDER — ASPIRIN 81 MG PO CHEW
162.0000 mg | CHEWABLE_TABLET | Freq: Once | ORAL | Status: AC
  Administered 2015-07-13: 162 mg via ORAL
  Filled 2015-07-13: qty 2

## 2015-07-13 MED ORDER — ESTRADIOL 2 MG PO TABS
2.0000 mg | ORAL_TABLET | Freq: Every day | ORAL | Status: DC
  Administered 2015-07-13: 2 mg via ORAL
  Filled 2015-07-13 (×2): qty 1

## 2015-07-13 NOTE — H&P (Signed)
ADMISSION HISTORY AND PHYSICAL   Date: 07/13/2015               Patient Name:  Cheryl Irwin MRN: [id number]  DOB: [birth year] Age / Sex: 49 y.o., female        PCP: KAPLAN,KRISTEN Primary Cardiologist: Croitoru         History of Present Illness: Patient is a 49 y.o. female with a PMHx of palpitations , ,    PFO, s/p partial thyroidectomy, kidney stones,  who was admitted to [facility] on 07/13/2015 for evaluation of very rapid SVT  Pt has had tachypalpitations for years.  Had a loop recorder placed 2 years ago Started last night ,  Occurred off and on all night on Associated with dyspnea, sharp  .   Came into the ER and was found to have very rapid episodes of  Narrow complex tachycardia. Had multilple episodes.    Medications: Outpatient medications:  (Not in a hospital admission)  Allergies  Allergen Reactions  . Sulfa Antibiotics Hives  . Sunscreens Hives     Past Medical History  Diagnosis Date  . Hypothyroid   . PFO (patent foramen ovale)   . Palpitations 07/2013    Loop Recorder Implanted  . Chest pain   . SVD (spontaneous vaginal delivery)     x 2  . Headache     otc med prn    Past Surgical History  Procedure Laterality Date  . Abdominal hysterectomy  29 yrs olds  . Kidney surgery  2003    to fix blockage in uretra  . Lymph node biopsy  2001  . Breast implant exchange  2008  . Tonsillectomy  as a child  . Elbow surgery Left 2010  . Loop recorder implant N/A 07/27/2013    Procedure: LOOP RECORDER IMPLANT;  Surgeon: Thurmon Fair, MD;  Location: [facility] CATH LAB;  Service: Cardiovascular;  Laterality: N/A;  . Thyroidectomy    . Parathyroidectomy      partial  . Wisdom tooth extraction    . Breast surgery      augmentation, new implants  . Kidney stone surgery      mutiple  . Vulvectomy partial Bilateral 03/13/2015    Procedure: VULVECTOMY PARTIAL;  Surgeon: Shea Evans, MD;  Location: [facility] ORS;  Service: Gynecology;  Laterality: Bilateral;     Family History  Problem Relation Age of Onset  . Cancer Mother   . High blood pressure Mother   . Hyperlipidemia Mother   . Lupus Mother   . Hypertension Father   . Hyperlipidemia Father     Social History:  reports that she has never smoked. She has never used smokeless tobacco. She reports that she drinks about 1.0 - 1.5 oz of alcohol per week. She reports that she does not use illicit drugs.   Review of Systems: Constitutional:  denies fever, chills, diaphoresis, appetite change and fatigue.  HEENT: denies photophobia, eye pain, redness, hearing loss, ear pain, congestion, sore throat, rhinorrhea, sneezing, neck pain, neck stiffness and tinnitus.  Respiratory: denies SOB, DOE, cough, chest tightness, and wheezing.  Cardiovascular: admits to chest pain, palpitations , denies  leg swelling.  Gastrointestinal: denies nausea, vomiting, abdominal pain, diarrhea, constipation, blood in stool.  Genitourinary: denies dysuria, urgency, frequency, hematuria, flank pain and difficulty urinating.  Musculoskeletal: denies  myalgias, back pain, joint swelling, arthralgias and gait problem.   Skin: denies pallor, rash and wound.  Neurological: denies dizziness, seizures, syncope, weakness, light-headedness,  numbness and headaches.   Hematological: denies adenopathy, easy bruising, personal or family bleeding history.  Psychiatric/ Behavioral: denies suicidal ideation, mood changes, confusion, nervousness, sleep disturbance and agitation.    Physical Exam: BP 111/79 mmHg  Pulse 74  Temp(Src) 98.1 F (36.7 C) (Oral)  Resp 18  Ht  (1.6 m)  Wt 48.988 kg (108 lb)  BMI 19.14 kg/m2  SpO2 99%  Wt Readings from Last 3 Encounters:  07/13/15 48.988 kg (108 lb)  03/03/15 49.896 kg (110 lb)  01/14/15 51.801 kg (114 lb 3.2 oz)    General: Vital signs reviewed and noted. Well-developed, well-nourished, in no acute distress; alert,   Head: Normocephalic, atraumatic, sclera anicteric,  mucus membranes are moist   Neck: Supple. Negative for carotid bruits. JVD not elevated.   Lungs:  Clear bilaterally to auscultation without wheezes, rales, or rhonchi. Breathing is normal   Heart: RRR with S1 S2. No murmurs, rubs, or gallops.   Abdomen:  Soft, non-tender, non-distended with normoactive bowel sounds. No hepatomegaly. No rebound/guarding. No obvious abdominal masses   MSK: Strength and the appear normal for age.   Extremities: No clubbing or cyanosis. No edema.  Distal pedal pulses are 2+ and equal bilaterally .  Neurologic: Alert and oriented X 3. Moves all extremities spontaneously   Psych:  normal     Lab results: Basic Metabolic Panel:  Recent Labs Lab 07/13/15 0934  NA 140  K 3.2*  CL 105  CO2 23  GLUCOSE 119*  BUN 23*  CREATININE 0.83  CALCIUM 8.6*  MG 1.9    Liver Function Tests: No results for input(s): AST, ALT, ALKPHOS, BILITOT, PROT, ALBUMIN in the last 168 hours. No results for input(s): LIPASE, AMYLASE in the last 168 hours.  CBC:  Recent Labs Lab 07/13/15 0934  WBC 10.1  NEUTROABS 8.3*  HGB 11.7*  HCT 34.8*  MCV 85.1  PLT 223    Cardiac Enzymes: No results for input(s): CKTOTAL, CKMB, CKMBINDEX, TROPONINI in the last 168 hours.  BNP: Invalid input(s): POCBNP  CBG: No results for input(s): GLUCAP in the last 168 hours.  Coagulation Studies: No results for input(s): LABPROT, INR in the last 72 hours.   Other results:  EKG :  NSR , normal   Review of the record her temperature strips reveal multiple episodes of supraventricular tachycardia at a rate of over 200.   Imaging: Dg Chest 2 View  07/13/2015   CLINICAL DATA:  Chest and left arm pain and shortness of breath beginning last night. Initial encounter.  EXAM: CHEST  2 VIEW  COMPARISON:  PA and lateral chest 08/01/2011.  FINDINGS: The lungs are clear. Heart size is normal. No pneumothorax or pleural effusion. Loop recorder is noted. There is mild scoliosis.  IMPRESSION:  No acute disease.   Electronically Signed   By: Drusilla Kanner M.D.   On: 07/13/2015 10:06        Assessment & Plan:  1.  Supraventricular tachycardia :   The patient has a loop recorder which recorded multiple episodes of supraventricular tachycardia at a rate above 200 bpm. He's episodes lasted for somewhat less than a minute. There were multiple episodes last night. The patient was very asymptomatic and felt chest discomfort. This point she still has some nagging chest pain. Her troponin is abnormal which I suspect is due to demand ischemia.  Her blood pressure is marginal today and so I do not think that she would tolerate a beta blocker.  I think  our best course of action is to perform EP study tomorrow with possible RF ablation.  We will have EP see her in the morning.  2. Elevated troponin level: The patient's troponin level was mildly elevated. I suspect this is because of her prolonged episodes of very fast heart rate. There is no indication of an acute cornea syndrome. We may consider stress test as an outpatient but for now I think that her severe tachycardia the likely cause.  3. Patent foramen ovale:  4. Elevated d-dimer:  There is no evidence of pulmonary edema.  DVT PPX -    Alvia Grove., MD, [facility] 07/13/2015, 1:26 PM

## 2015-07-13 NOTE — Telephone Encounter (Signed)
Cheryl Irwin is a 49 y.o. female with a hx of palpitations.  She is s/p ILR.  Myoview in 2014 was low risk.  Echo in 2014 demonstrated normal LVF, + PFO.    She called in today b/c she developed rapid palpitations with assoc L chest tightness, SOB, L arm and L jaw pain.  She was dizzy but did no pass out.  Her symptoms lasted from ~ 8 pm to ~ 5 am.  She now feels weak and has pleuritic L chest pain and L arm and L neck pain.  These symptoms are similar to prior episodes.  I have advised the patient to come to the ED at [facility] for further evaluation given her chest symptoms that sound concerning for angina.  She agrees with this plan.  Tereso Newcomer, PA-C   07/13/2015 8:39 AM

## 2015-07-13 NOTE — ED Notes (Signed)
Pt here for shortness of breath, arm pain, and palpitations. Pt reports it initially started last night and felt like her heart was racing and skipping beats. She reports some dizziness last night. Pt also reports arm pain that began around 0230 in the morning. She reports her chest feels tight and she feels out of breath. She took 2 asa tablets at home. No distress noted.

## 2015-07-13 NOTE — ED Provider Notes (Signed)
CSN: [id number]     Arrival date & time 07/13/15  4098 History   First MD Initiated Contact with Patient 07/13/15 0913     Chief Complaint  Patient presents with  . Palpitations  . Shortness of Breath     (Consider location/radiation/quality/duration/timing/severity/associated sxs/prior Treatment) HPI Cheryl Irwin is a 48 y.o. female with history of PFO, palpitations with implanted loop recorder, presents to emergency department with complaint of left-sided chest pain and palpitations. Patient states she was hosting ident a party at her house last night when she suddenly started feeling like her heart was racing. States it felt like it was beating "really fast and skipping beats." She did not check her heart rate. She states that she was feeling dizzy and lightheaded at that time. She states that she was short of breath. She reports that she was able to go to sleep still having symptoms, however also started having pain to the left side of the neck, left ear, radiating down left arm. She states this morning she continued to have pain but palpitations have resolved. She states that she feels fatigued. She called cardiologist and was told to come to emergency dept.   Past Medical History  Diagnosis Date  . Hypothyroid   . PFO (patent foramen ovale)   . Palpitations 07/2013    Loop Recorder Implanted  . Chest pain   . SVD (spontaneous vaginal delivery)     x 2  . Headache     otc med prn   Past Surgical History  Procedure Laterality Date  . Abdominal hysterectomy  29 yrs olds  . Kidney surgery  2003    to fix blockage in uretra  . Lymph node biopsy  2001  . Breast implant exchange  2008  . Tonsillectomy  as a child  . Elbow surgery Left 2010  . Loop recorder implant N/A 07/27/2013    Procedure: LOOP RECORDER IMPLANT;  Surgeon: Thurmon Fair, MD;  Location: [facility] CATH LAB;  Service: Cardiovascular;  Laterality: N/A;  . Thyroidectomy    . Parathyroidectomy      partial  . Wisdom  tooth extraction    . Breast surgery      augmentation, new implants  . Kidney stone surgery      mutiple  . Vulvectomy partial Bilateral 03/13/2015    Procedure: VULVECTOMY PARTIAL;  Surgeon: Shea Evans, MD;  Location: [facility] ORS;  Service: Gynecology;  Laterality: Bilateral;   Family History  Problem Relation Age of Onset  . Cancer Mother   . High blood pressure Mother   . Hyperlipidemia Mother   . Lupus Mother   . Hypertension Father   . Hyperlipidemia Father    History  Substance Use Topics  . Smoking status: Never Smoker   . Smokeless tobacco: Never Used  . Alcohol Use: 1.0 - 1.5 oz/week    2-3 Standard drinks or equivalent per week     Comment: social   OB History    No data available     Review of Systems  Constitutional: Positive for fatigue. Negative for fever and chills.  Respiratory: Positive for chest tightness and shortness of breath. Negative for cough.   Cardiovascular: Positive for chest pain and palpitations. Negative for leg swelling.  Gastrointestinal: Negative for nausea, vomiting, abdominal pain and diarrhea.  Genitourinary: Negative for dysuria and flank pain.  Musculoskeletal: Negative for myalgias, arthralgias, neck pain and neck stiffness.  Skin: Negative for rash.  Neurological: Positive for weakness. Negative for dizziness  and headaches.  All other systems reviewed and are negative.     Allergies  Sulfa antibiotics and Sunscreens  Home Medications   Prior to Admission medications   Medication Sig Start Date End Date Taking? Authorizing Provider  estradiol (ESTRACE) 2 MG tablet Take 2 mg by mouth every morning.     Historical Provider, MD  ibuprofen (ADVIL,MOTRIN) 600 MG tablet Take 1 tablet (600 mg total) by mouth every 6 (six) hours as needed. 03/13/15   Shea Evans, MD  levothyroxine (SYNTHROID, LEVOTHROID) 75 MCG tablet Take 75 mcg by mouth every morning.     Historical Provider, MD  Multiple Vitamin (MULTIVITAMIN WITH MINERALS) TABS  tablet Take 1 tablet by mouth daily.    Historical Provider, MD  oxyCODONE-acetaminophen (ROXICET) 5-325 MG per tablet Take 1 tablet by mouth every 4 (four) hours as needed for severe pain. 03/13/15   Shea Evans, MD   BP 102/58 mmHg  Pulse 79  Temp(Src) 98.1 F (36.7 C) (Oral)  Resp 20  Ht  (1.6 m)  Wt 108 lb (48.988 kg)  BMI 19.14 kg/m2  SpO2 100% Physical Exam  Constitutional: She appears well-developed and well-nourished. No distress.  HENT:  Head: Normocephalic.  Eyes: Conjunctivae are normal.  Neck: Neck supple.  Cardiovascular: Normal rate, regular rhythm and normal heart sounds.   Pulmonary/Chest: Effort normal and breath sounds normal. No respiratory distress. She has no wheezes. She has no rales.  Abdominal: Soft. Bowel sounds are normal. She exhibits no distension. There is no tenderness. There is no rebound.  Musculoskeletal: She exhibits no edema.  No calf swelling bilaterally. DP pulses intact and equal bilaterally  Neurological: She is alert.  Skin: Skin is warm and dry.  Psychiatric: She has a normal mood and affect. Her behavior is normal.  Nursing note and vitals reviewed.   ED Course  Procedures (including critical care time) Labs Review Labs Reviewed  CBC WITH DIFFERENTIAL/PLATELET - Abnormal; Notable for the following:    Hemoglobin 11.7 (*)    HCT 34.8 (*)    Neutrophils Relative % 82 (*)    Neutro Abs 8.3 (*)    All other components within normal limits  BASIC METABOLIC PANEL - Abnormal; Notable for the following:    Potassium 3.2 (*)    Glucose, Bld 119 (*)    BUN 23 (*)    Calcium 8.6 (*)    All other components within normal limits  D-DIMER, QUANTITATIVE (NOT AT [facility]) - Abnormal; Notable for the following:    D-Dimer, Quant 0.66 (*)    All other components within normal limits  TROPONIN I - Abnormal; Notable for the following:    Troponin I 0.37 (*)    All other components within normal limits  I-STAT TROPOININ, ED - Abnormal; Notable  for the following:    Troponin i, poc 0.20 (*)    All other components within normal limits  MAGNESIUM    Imaging Review No results found.   EKG Interpretation   Date/Time:  Sunday July 13 2015 09:08:59 EDT Ventricular Rate:  81 PR Interval:  200 QRS Duration: 79 QT Interval:  387 QTC Calculation: 449 R Axis:   93 Text Interpretation:  Sinus rhythm Borderline right axis deviation No  previous ECGs available Confirmed by RANCOUR  MD, STEPHEN ([id number]) on  07/13/2015 9:16:10 AM      MDM   Final diagnoses:  Supraventricular tachycardia   Pt with palpitations onset 10pm which have now resolved. Pt with implanted loop  recorder. Will interrigate. Pt also having pleuritic chest pain. Will get labs, including d dimer. CXR.  VS normal at this time.  ECG normal.   11:15 AM [employer] report shows 13 episodes of what appears to be SVT, lasting anywhere from several seconds to up to 3 minutes. Patient's troponin is bumped to 0.2. She received aspirin. I spoke with cardiology and they will come down and see patient. She is currently rate controlled.  1:41 PM Cardiology at bedside. CT angio negative.  Pt will be admitted to cardiology service.   Filed Vitals:   07/13/15 1400 07/13/15 1415 07/13/15 1430 07/13/15 1445  BP: 115/69 113/81 118/91 110/79  Pulse: 69 72 68 67  Temp:      TempSrc:      Resp: 17 16 17 17   Height:      Weight:      SpO2: 99% 100% 100% 99%     Jaynie Crumble, PA-C 07/13/15 1505  Glynn Octave, MD 07/13/15 1757

## 2015-07-14 ENCOUNTER — Encounter (HOSPITAL_COMMUNITY): Payer: Self-pay | Admitting: Physician Assistant

## 2015-07-14 DIAGNOSIS — I471 Supraventricular tachycardia: Secondary | ICD-10-CM | POA: Diagnosis not present

## 2015-07-14 DIAGNOSIS — Z959 Presence of cardiac and vascular implant and graft, unspecified: Secondary | ICD-10-CM | POA: Diagnosis not present

## 2015-07-14 LAB — BASIC METABOLIC PANEL
Anion gap: 5 (ref 5–15)
BUN: 13 mg/dL (ref 6–20)
CHLORIDE: 106 mmol/L (ref 101–111)
CO2: 29 mmol/L (ref 22–32)
CREATININE: 0.72 mg/dL (ref 0.44–1.00)
Calcium: 8.3 mg/dL — ABNORMAL LOW (ref 8.9–10.3)
GFR calc non Af Amer: >60 mL/min (ref >60)
Glucose, Bld: 80 mg/dL (ref 65–99)
POTASSIUM: 4.2 mmol/L (ref 3.5–5.1)
Sodium: 140 mmol/L (ref 135–145)

## 2015-07-14 LAB — TROPONIN I: TROPONIN I: 0.15 ng/mL — AB (ref <0.031)

## 2015-07-14 LAB — TSH: TSH: 2.333 u[IU]/mL (ref 0.350–4.500)

## 2015-07-14 MED ORDER — PROPRANOLOL HCL 10 MG PO TABS: ORAL_TABLET | ORAL | Status: AC

## 2015-07-14 MED ORDER — PROPRANOLOL HCL 10 MG PO TABS: ORAL_TABLET | ORAL | Status: DC

## 2015-07-14 NOTE — Consult Note (Signed)
Electrophysiology Consultation Note  Patient ID: Cheryl Irwin, MRN: [id number], DOB/AGE: [birth year] 49 y.o. Admit date: 07/13/2015   Date of Consult: 07/14/2015 Primary Physician: Lilia Argue Primary Cardiologist: Dr. 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 She also has history of some sort of neurologic event - at age 49 she had neurological event, some MRI abnormalities that resolved and diagnosed as possible multiple sclerosis versus atypical migraine. In April 2015 she had possible aphasia and a CT head was normal; MRI not performed. In November 2015 she had syncope preceded by protracted dizziness and "vision going black", fall without injury and quick recovery, followed by unilateral headache and jaw/chest discomfort. She did not activate her loop recorder on either occasion (again no arrhythmic events recorded). She has had an ILR since 2014 due to palpitations she had [place].  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 The symptoms persisted on/off for several hours. She tried to work through the symptoms due to the dinner party. She tried lying down on the ground with her arms above her head and bearing down. This gave no relief, but she would intermittently feel symptoms ease off for 30-40 minutes at a time. She finally tried to go to bed around 5am, but around 7:30am woke up with "excruciating" pain down her left neck and shoulder, sharp in nature. Due to symptoms she came to the ER where ILR interpretation reportedly showed very rapid episodes of  narrow complex tachycardia above 200bpm. She was not started on BB due to marginal blood pressures which the patient states is chronic for her. Labwork notable for K 3.2, glucose 119, Hgb 11.7, Mg 1.9, troponin 0.20->0.37. D-dimer 0.66. CTA showed no evidence of PE. Tele NSR ever since. Although she does not exercise she states she is "on the go" constantly without any functional limitation recently. She went to the beach and also went hiking recently without any CP or SOB.  I do not have present records available from the interrogation but the patient states she was told the longest episode was about 3 minutes.   Past Medical History  Diagnosis Date  . Hypothyroidism     a. H/o Hashimoto's, s/p thyroid lobe removal and parathyroid removal.  . PFO (patent foramen ovale)   . Palpitations 07/2013    Loop Recorder Implanted  . SVD (spontaneous vaginal delivery)     x 2  . Headache     otc med prn  . Neurologic abnormality     a. Age 49 - neurological event, some MRI abnormalities that resolved and diagnosed as possible multiple sclerosis versus atypical migraine. b. Aphasia in 2015 - CT head negative, question migraine.  . Iliac vessel injury     a. During surgery to fix ureter she states there was injury to right iliac and leg went gangrenous thus a stent was placed by vascular surgery  . Nephrolithiasis     a. During hysterectomy for endometriosis, she states her ureter was nicked and since that time she has had issues with kidney stones. In fixing the ureter she reports there was damage to her iliac artery and  she received a stent.  . Renal cyst     a. Pt reports upper quadrant kidney cyst being followed for now @ [facility].      Most Recent Cardiac Studies: 2D Echo 07/2013 - Procedure narrative: Transthoracic echocardiography. Image quality was suboptimal. The study was technicallydifficult, as a result of breast implants. Intravenous contrast (agitated saline) was administered. - Left  ventricle: The cavity size was normal. Wall thicknesswas normal. Systolic function was normal. The estimatedejection fraction was in the range of 55% to 60%. Wallmotion was normal; there were no regional wall motionabnormalities. Left ventricular diastolic functionparameters were normal. - Atrial septum: There was a patent foramen ovale. There isimmediate passage of a small amount of contrastmicrobubbles from right to left at the atrial level,consistent with a small PFO. - Inferior vena cava: The vessel was normal in size; therespirophasic diameter changes were in the normal range (=50%); findings are consistent with normal central venouspressure.   Surgical History:  Past Surgical History  Procedure Laterality Date  . Abdominal hysterectomy  29 yrs olds  . Kidney surgery  2003    to fix blockage in uretra  . Lymph node biopsy  2001  . Breast implant exchange  2008  . Tonsillectomy  as a child  . Elbow surgery Left 2010  . Loop recorder implant N/A 07/27/2013    Procedure: LOOP RECORDER IMPLANT;  Surgeon: Thurmon Fair, MD;  Location: [facility] CATH LAB;  Service: Cardiovascular;  Laterality: N/A;  . Thyroidectomy    . Parathyroidectomy      partial  . Wisdom tooth extraction    . Breast surgery      augmentation, new implants  . Kidney stone surgery      mutiple  . Vulvectomy partial Bilateral 03/13/2015    Procedure: VULVECTOMY PARTIAL;  Surgeon: Shea Evans, MD;  Location: [facility] ORS;  Service: Gynecology;  Laterality: Bilateral;     Home Meds: Prior to Admission medications   Medication Sig Start Date End Date Taking? Authorizing Provider  aspirin EC 81 MG tablet Take 162 mg by mouth once as needed for mild pain.   Yes Historical Provider, MD  estradiol (ESTRACE) 2 MG tablet Take 2 mg by mouth every evening.    Yes Historical Provider, MD  levothyroxine (SYNTHROID, LEVOTHROID) 75 MCG tablet Take 75 mcg by mouth every morning.    Yes Historical Provider, MD  Multiple Vitamin  (MULTIVITAMIN WITH MINERALS) TABS tablet Take 1 tablet by mouth daily.   Yes Historical Provider, MD    Inpatient Medications:  . estradiol  2 mg Oral QHS  . levothyroxine  75 mcg Oral QAC breakfast  . multivitamin with minerals  1 tablet Oral Daily      Allergies:  Allergies  Allergen Reactions  . Sulfa Antibiotics Hives  . Sunscreens Hives    History   Social History  . Marital Status: Legally Separated    Spouse Name: N/A  . Number of Children: N/A  . Years of Education: N/A   Occupational History  . Not on file.   Social History Main Topics  . Smoking status: Never Smoker   . Smokeless tobacco: Never Used  . Alcohol Use: 1.8 - 2.4 oz/week    3-4 Standard drinks or equivalent per week     Comment: social - 3-4 glasses of wine a week  . Drug Use: No  . Sexual Activity: Yes    Birth Control/ Protection: Surgical   Other Topics Concern  . Not on file  Social History Narrative     Family History  Problem Relation Age of Onset  . Cancer Mother   . High blood pressure Mother   . Hyperlipidemia Mother   . Lupus Mother   . Hypertension Father   . Hyperlipidemia Father      Review of Systems: All other systems reviewed and are otherwise negative except as noted above.  Labs:  Recent Labs  07/13/15 1246  TROPONINI 0.37*   Lab Results  Component Value Date   WBC 10.1 07/13/2015   HGB 11.7* 07/13/2015   HCT 34.8* 07/13/2015   MCV 85.1 07/13/2015   PLT 223 07/13/2015    Recent Labs Lab 07/13/15 0934  NA 140  K 3.2*  CL 105  CO2 23  BUN 23*  CREATININE 0.83  CALCIUM 8.6*  GLUCOSE 119*   Lab Results  Component Value Date   CHOL 191 07/20/2013   HDL 65 07/20/2013   LDLCALC 106* 07/20/2013   TRIG 100 07/20/2013   Lab Results  Component Value Date   DDIMER 0.66* 07/13/2015    Radiology/Studies:  Dg Chest 2 View  07/13/2015   CLINICAL DATA:  Chest and left arm pain and shortness of breath beginning last night. Initial encounter.   EXAM: CHEST  2 VIEW  COMPARISON:  PA and lateral chest 08/01/2011.  FINDINGS: The lungs are clear. Heart size is normal. No pneumothorax or pleural effusion. Loop recorder is noted. There is mild scoliosis.  IMPRESSION: No acute disease.   Electronically Signed   By: Drusilla Kanner M.D.   On: 07/13/2015 10:06   Ct Angio Chest Pe W/cm &/or Wo Cm  07/13/2015   CLINICAL DATA:  Chest tightness in left arm pain since this morning. Shortness of breath since last night.  EXAM: CT ANGIOGRAPHY CHEST WITH CONTRAST  TECHNIQUE: Multidetector CT imaging of the chest was performed using the standard protocol during bolus administration of intravenous contrast. Multiplanar CT image reconstructions and MIPs were obtained to evaluate the vascular anatomy.  CONTRAST:  80mL OMNIPAQUE IOHEXOL 350 MG/ML SOLN  COMPARISON:  Chest radiographs earlier today  FINDINGS: Pulmonary arterial opacification is adequate without filling defects seen to indicate emboli. Heart is normal in size. Bilateral breast implants are noted. No enlarged axillary, mediastinal, or hilar lymph nodes are identified. There is no pleural or pericardial effusion.  There is minimal scarring in the lung apices. Minimal dependent atelectasis is present in both lower lobes. There is a punctate calcification in the basilar right lower lobe. 10 mm opacity in the lateral right upper lobe extends to the pleura and likely represents an area of scarring or focal atelectasis. Major airways are clear.  One or 2 punctate calculi are noted in the upper pole of the left kidney. A loop recorder is noted. No acute osseous abnormality is identified.  Review of the MIP images confirms the above findings.  IMPRESSION: 1. No evidence of pulmonary emboli. 2. Left nephrolithiasis.   Electronically Signed   By: Sebastian Ache   On: 07/13/2015 13:38    EKG: NSR 81bpm, no acute ST-T changes  Physical Exam: Blood pressure 99/63, pulse 66, temperature 98.1 F (36.7 C), temperature  source Oral, resp. rate 15, height 5\' 3"  (1.6 m), weight 114 lb 9.6 oz (51.982 kg), SpO2 100 %. General: Well developed, well nourished thin WF in no acute distress. Head: Normocephalic, atraumatic, sclera non-icteric, no xanthomas, nares are without discharge.  Neck: JVD not elevated. Lungs: Clear bilaterally to auscultation without wheezes, rales,  or rhonchi. Breathing is unlabored. Heart: RRR with S1 S2. No murmurs, rubs, or gallops appreciated. Abdomen: Soft, non-tender, non-distended with normoactive bowel sounds. No hepatomegaly. No obvious abdominal masses. Msk:  Strength and tone appear normal for age. Extremities: No clubbing or cyanosis. No edema.  Distal pedal pulses are 2+ and equal bilaterally. Neuro: Alert and oriented X 3. No facial asymmetry. No focal deficit. Moves all extremities spontaneously. Psych:  Responds to questions appropriately with a normal affect.    Assessment and Plan:   1. Reported symptomatic paroxysmal SVT - have asked Leta Jungling with [employer] to assist in getting the tracings so I can review with Dr. Graciela Husbands.  2. Elevated troponin - likely due to #1. She has limited cardiac risk factors. No family history, nonsmoker, no hx of HTN, DM, HLD. Mildly elevated glucose likely r/t # 1 given previously normal levels. Will f/u another level to trend. She has not had any recent anginal sx outside of symptoms surrounding her tachycardia. Will review with Dr. Graciela Husbands but anticipate outpatient stress test is appropriate.  3. Hypokalemia - f/u K level and replete PRN.  4. Hypothyroidism - check TSH.  Signed, Ronie Spies PA-C 07/14/2015, 1:51 PM  Patient has a 10-15 year history of abrupt onset offset tachypalpitations which should've been increasingly problematic over the last 2 episodes the [place] and the latter occurring this weekend. Implantable loop recorder was inserted after the episode 2 years ago and its interrogation demonstrates a narrow  QRS tachycardia the onset of which is not recorded. However, comparing tachycardia with baseline rhythm, there appears to be a pseudo-R prime  during the tachycardia consistent with AV nodal reentry as is her history with a frog positive tachycardia. She would like never to have an episode again.  She would like to pursue catheter ablation  We reviewed the benefits and the risks of the procedure.  She has plans to go to [place] tomorrow with her daughter. We will let her go with a prescription for Inderal 10 mg to take in addition to reviewed vagal maneuvers

## 2015-07-14 NOTE — Discharge Summary (Signed)
Discharge Summary   Patient ID: Cheryl Irwin MRN: [id number], DOB/AGE: [birth year] 49 y.o. Admit date: 07/13/2015 D/C date:     07/14/2015  Primary Care Provider: Lilia Argue Primary Cardiologist: Dr. Royann Shivers EP: Seen by Dr. Graciela Husbands this admission  Primary Discharge Diagnoses:  1. Paroxysmal SVT 2. Elevated troponin felt likely due to #1 3. Hypokalemia 4. Hypothyroidism, TSH wnl  PMH: Past Medical History  Diagnosis Date  . Hypothyroidism     a. H/o Hashimoto's, s/p thyroid lobe removal and parathyroid removal.  . PFO (patent foramen ovale)   . Palpitations 07/2013    Loop Recorder Implanted  . SVD (spontaneous vaginal delivery)     x 2  . Headache     otc med prn  . Neurologic abnormality     a. Age 49 - neurological event, some MRI abnormalities that resolved and diagnosed as possible multiple sclerosis versus atypical migraine. b. Aphasia in 2015 - CT head negative, question migraine.  . Iliac vessel injury     a. During surgery to fix ureter she states there was injury to right iliac and leg went gangrenous thus a stent was placed by vascular surgery  . Nephrolithiasis     a. During hysterectomy for endometriosis, she states her ureter was nicked and since that time she has had issues with kidney stones. In fixing the ureter she reports there was damage to her iliac artery and she received a stent.  . Renal cyst     a. Pt reports upper quadrant kidney cyst being followed for now @ [facility].    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 She also has history of some sort of neurologic event - at age 44 she had neurological event, some MRI abnormalities that resolved and diagnosed as possible multiple sclerosis versus atypical migraine. In April 2015 she had possible aphasia and  a CT head was normal; MRI not performed. In November 2015 she had syncope preceded by protracted dizziness and "vision going black", fall without injury and quick recovery, followed by unilateral headache and jaw/chest discomfort. She did not activate her loop recorder on either occasion (again no arrhythmic events recorded). She has had an ILR since 2014 due to palpitations she had [place].  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 The symptoms persisted on/off for several hours. She tried to work through the symptoms to get through the dinner party. She tried lying down on the ground with her arms above her head and bearing down. This gave no relief, but she would intermittently feel symptoms ease off for 30-40 minutes at a time. She finally tried to go to bed around 5am, but around 7:30am woke up with "excruciating" pain down her left neck and shoulder, sharp in nature. Due to symptoms she came to the ER where ILR interpretation reportedly showed very rapid episodes of narrow complex tachycardia above 200bpm, c/w SVT. Dr. Graciela Husbands erports that "interrogation demonstrates a narrow QRS tachycardia the onset of which is not recorded. 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 Labwork notable for K 3.2, glucose 119, Hgb 11.7, Mg 1.9, troponin 0.20->0.37->0.15. D-dimer 0.66. CTA showed no evidence of PE. Tele NSR ever since. TSH was normal and repeat K was 4.2. Although she does not exercise she states she is "on the go" constantly without any functional limitation recently. She went to the  beach and also went hiking recently without any CP or SOB. Her elevated troponin was felt likely due to the SVT. An outpatient stress test can be considered. The patient discussed various options for treatment of her SVT including watchful waiting and ablation therapy. Ultimately the patient elected ablation. This was unable to be accommodated on the schedule for tomorrow because it is full. Since she is hemodynamically stable and asymptomatic, we will plan for this as an outpatient. The patient originally had plans to go to [place] tomorrow with her daughter and Dr. Graciela Husbands has cleared her to do so but the patient had already cancelled her plans. She was given a prescription for inderal 10mg  PRN (q48min up to 3 doses) in addition to reviewed vagal maneuvers. I called in her rx at her request (her address wouldn't let me e-scribe for some reason). I have sent a message to Glynda Jaeger, coordinator with the EP team, to help coordinate her ablation. Dr. Graciela Husbands has seen and examined the patient today and feels she is stable for discharge.   Discharge Vitals: Blood pressure 99/63, pulse 66, temperature 98.1 F (36.7 C), temperature source Oral, resp. rate 15, height 5\' 3"  (1.6 m), weight 114 lb 9.6 oz (51.982 kg), SpO2 100 %.  Labs: Lab Results  Component Value Date   WBC 10.1 07/13/2015   HGB 11.7* 07/13/2015   HCT 34.8* 07/13/2015   MCV 85.1 07/13/2015   PLT 223 07/13/2015    Recent Labs Lab 07/14/15 1420  NA 140  K 4.2  CL 106  CO2 29  BUN 13  CREATININE 0.72  CALCIUM 8.3*  GLUCOSE 80    Recent Labs  07/13/15 1246 07/14/15 1420  TROPONINI 0.37* 0.15*   Lab Results  Component Value Date   CHOL 191 07/20/2013   HDL 65 07/20/2013   LDLCALC 106* 07/20/2013   TRIG 100 07/20/2013   Lab Results  Component Value Date   DDIMER 0.66* 07/13/2015    Diagnostic Studies/Procedures   Dg Chest 2 View  07/13/2015   CLINICAL DATA:  Chest and left arm pain and shortness of breath beginning  last night. Initial encounter.  EXAM: CHEST  2 VIEW  COMPARISON:  PA and lateral chest 08/01/2011.  FINDINGS: The lungs are clear. Heart size is normal. No pneumothorax or pleural effusion. Loop recorder is noted. There is mild scoliosis.  IMPRESSION: No acute disease.   Electronically Signed   By: Drusilla Kanner M.D.   On: 07/13/2015 10:06   Ct Angio Chest Pe W/cm &/or Wo Cm  07/13/2015   CLINICAL DATA:  Chest tightness in left arm pain since this morning. Shortness of breath since last night.  EXAM: CT ANGIOGRAPHY CHEST WITH CONTRAST  TECHNIQUE: Multidetector CT imaging of the chest was performed using the standard protocol during bolus administration of intravenous contrast. Multiplanar CT image reconstructions and MIPs were obtained to evaluate the vascular anatomy.  CONTRAST:  80mL OMNIPAQUE IOHEXOL 350 MG/ML SOLN  COMPARISON:  Chest radiographs earlier today  FINDINGS: Pulmonary arterial opacification is adequate without filling defects seen to indicate  emboli. Heart is normal in size. Bilateral breast implants are noted. No enlarged axillary, mediastinal, or hilar lymph nodes are identified. There is no pleural or pericardial effusion.  There is minimal scarring in the lung apices. Minimal dependent atelectasis is present in both lower lobes. There is a punctate calcification in the basilar right lower lobe. 10 mm opacity in the lateral right upper lobe extends to the pleura and likely represents an area of scarring or focal atelectasis. Major airways are clear.  One or 2 punctate calculi are noted in the upper pole of the left kidney. A loop recorder is noted. No acute osseous abnormality is identified.  Review of the MIP images confirms the above findings.  IMPRESSION: 1. No evidence of pulmonary emboli. 2. Left nephrolithiasis.   Electronically Signed   By: Sebastian Ache   On: 07/13/2015 13:38    Discharge Medications   Current Discharge Medication List    START taking these medications    Details  propranolol (INDERAL) 10 MG tablet Take 1 tablet ( ) by mouth daily as needed for palpitations. May repeat every 30 minutes for 3 doses total. Qty: 30 tablet, Refills: 1      CONTINUE these medications which have NOT CHANGED   Details  aspirin EC 81 MG tablet Take 162 mg by mouth once as needed for mild pain.    estradiol (ESTRACE) 2 MG tablet Take 2 mg by mouth every evening.     levothyroxine (SYNTHROID, LEVOTHROID) 75 MCG tablet Take 75 mcg by mouth every morning.     Multiple Vitamin (MULTIVITAMIN WITH MINERALS) TABS tablet Take 1 tablet by mouth daily.        Disposition   The patient will be discharged in stable condition to home. Discharge Instructions    Diet - low sodium heart healthy    Complete by:  As directed      Increase activity slowly    Complete by:  As directed   Your doctor has added propranolol to take as needed. If you feel palpitations, try the Valsalva maneuver (bearing down). If this does not work, you may take 1 tablet of propranolol every 30 minutes up to 3 doses total. If symptoms do not go away or get worse, seek medical attention.          Follow-up Information    Follow up with Sherryl Manges, MD.   Specialty:  Cardiology   Why:  Office will call you for your followup appointment/procedure details. Call office if you have not heard back in 3 days.   Contact information:   [address] [place] [address] [phone]         Duration of Discharge Encounter: Greater than 30 minutes including physician and PA time.  Thomasene Mohair PA-C 07/14/2015, 5:40 PM

## 2015-07-23 NOTE — Progress Notes (Signed)
Loop recorder 

## 2015-07-24 ENCOUNTER — Encounter: Payer: Self-pay | Admitting: Cardiovascular Disease

## 2015-07-24 ENCOUNTER — Encounter: Payer: Self-pay | Admitting: *Deleted

## 2015-07-24 ENCOUNTER — Ambulatory Visit (INDEPENDENT_AMBULATORY_CARE_PROVIDER_SITE_OTHER): Payer: 59 | Admitting: Internal Medicine

## 2015-07-24 ENCOUNTER — Encounter: Payer: Self-pay | Admitting: Internal Medicine

## 2015-07-24 VITALS — BP 110/68 | HR 65 | Ht 63.5 in | Wt 109.2 lb

## 2015-07-24 DIAGNOSIS — I471 Supraventricular tachycardia: Secondary | ICD-10-CM | POA: Diagnosis not present

## 2015-07-24 LAB — CUP PACEART INCLINIC DEVICE CHECK
Date Time Interrogation Session: 20160804162259
Zone Setting Detection Interval: 2000 ms
Zone Setting Detection Interval: 3000 ms
Zone Setting Detection Interval: 350 ms

## 2015-07-24 NOTE — Assessment & Plan Note (Signed)
I have discussed the treatment options with the patient in detail. She has SVT at 200/min and has not tolerated medical therapy. She wishes to pursue catheter ablation. The risks/benefits/goals/expectations of the procedure have been discussed with the patient and she wishes to proceed.

## 2015-07-24 NOTE — Progress Notes (Signed)
HPI Cheryl Irwin is referred today by Dr. Graciela Husbands for evaluation of SVT. She is a pleasant 49 yo woman with palpitations who has had symptoms which have waxed and waned for years. She had a ILR inserted in 2014. She was found to have SVT at 200/min. She notes associated sob, chest pressure, neck and jaw discomfort and near syncope. The patient was seen in the ER a couple of weeks ago and was observed overnight. In SVT she had a small elevation of her troponin. She was given a prescription for propranolol which she has not taken.  Allergies  Allergen Reactions  . Sulfa Antibiotics Hives  . Sunscreens Hives     Current Outpatient Prescriptions  Medication Sig Dispense Refill  . estradiol (ESTRACE) 2 MG tablet Take 2 mg by mouth every evening.     Marland Kitchen levothyroxine (SYNTHROID, LEVOTHROID) 75 MCG tablet Take 75 mcg by mouth every morning.     . Multiple Vitamin (MULTIVITAMIN WITH MINERALS) TABS tablet Take 1 tablet by mouth every other day.     . propranolol (INDERAL) 10 MG tablet Take 1 tablet ( ) by mouth daily as needed for palpitations. May repeat every 30 minutes for 3 doses total. 30 tablet 1   No current facility-administered medications for this visit.     Past Medical History  Diagnosis Date  . Hypothyroidism     a. H/o Hashimoto's, s/p thyroid lobe removal and parathyroid removal.  . PFO (patent foramen ovale)   . Palpitations 07/2013    Loop Recorder Implanted  . SVD (spontaneous vaginal delivery)     x 2  . Headache     otc med prn  . Neurologic abnormality     a. Age 30 - neurological event, some MRI abnormalities that resolved and diagnosed as possible multiple sclerosis versus atypical migraine. b. Aphasia in 2015 - CT head negative, question migraine.  . Iliac vessel injury     a. During surgery to fix ureter she states there was injury to right iliac and leg went gangrenous thus a stent was placed by vascular surgery  . Nephrolithiasis     a. During hysterectomy  for endometriosis, she states her ureter was nicked and since that time she has had issues with kidney stones. In fixing the ureter she reports there was damage to her iliac artery and she received a stent.  . Renal cyst     a. Pt reports upper quadrant kidney cyst being followed for now @ [facility].    ROS:   All systems reviewed and negative except as noted in the HPI.   Past Surgical History  Procedure Laterality Date  . Abdominal hysterectomy  29 yrs olds  . Kidney surgery  2003    to fix blockage in uretra  . Lymph node biopsy  2001  . Breast implant exchange  2008  . Tonsillectomy  as a child  . Elbow surgery Left 2010  . Loop recorder implant N/A 07/27/2013    Procedure: LOOP RECORDER IMPLANT;  Surgeon: Thurmon Fair, MD;  Location: [facility] CATH [facility];  Service: Cardiovascular;  Laterality: N/A;  . Thyroidectomy    . Parathyroidectomy      partial  . Wisdom tooth extraction    . Breast surgery      augmentation, new implants  . Kidney stone surgery      mutiple  . Vulvectomy partial Bilateral 03/13/2015    Procedure: VULVECTOMY PARTIAL;  Surgeon: Shea Evans, MD;  Location: [facility]  ORS;  Service: Gynecology;  Laterality: Bilateral;     Family History  Problem Relation Age of Onset  . Cancer Mother   . High blood pressure Mother   . Hyperlipidemia Mother   . Lupus Mother   . Hypertension Father   . Hyperlipidemia Father      History   Social History  . Marital Status: Legally Separated    Spouse Name: N/A  . Number of Children: N/A  . Years of Education: N/A   Occupational History  . Not on file.   Social History Main Topics  . Smoking status: Never Smoker   . Smokeless tobacco: Never Used  . Alcohol Use: 1.8 - 2.4 oz/week    3-4 Standard drinks or equivalent per week     Comment: social - 3-4 glasses of wine a week  . Drug Use: No  . Sexual Activity: Yes    Birth Control/ Protection: Surgical   Other Topics Concern  . Not on file   Social History Narrative      BP 110/68 mmHg  Pulse 65  Ht 5' 3.5" (1.613 m)  Wt 109 lb 3.2 oz (49.533 kg)  BMI 19.04 kg/m2  Physical Exam:  Well appearing 49 yo woman who looks younger than her stated age, NAD HEENT: Unremarkable Neck:  No JVD, no thyromegally Back:  No CVA tenderness Lungs:  Clear with no wheezes HEART:  Regular rate rhythm, no murmurs, no rubs, no clicks Abd:  soft, positive bowel sounds, no organomegally, no rebound, no guarding Ext:  2 plus pulses, no edema, no cyanosis, no clubbing Skin:  No rashes no nodules Neuro:  CN II through XII intact, motor grossly intact  EKG - reviewed - NSR with no pre-excitation  Assess/Plan:

## 2015-07-24 NOTE — Patient Instructions (Addendum)
Medication Instructions:  Your physician recommends that you continue on your current medications as directed. Please refer to the Current Medication list given to you today.   Labwork: Your physician recommends that you return for lab work on 08/21/15 at 8:30am   Testing/Procedures: Your physician has recommended that you have an ablation. Catheter ablation is a medical procedure used to treat some cardiac arrhythmias (irregular heartbeats). During catheter ablation, a long, thin, flexible tube is put into a blood vessel in your groin (upper thigh), or neck. This tube is called an ablation catheter. It is then guided to your heart through the blood vessel. Radio frequency waves destroy small areas of heart tissue where abnormal heartbeats may cause an arrhythmia to start. Please see the instruction sheet given to you today.   Follow-Up: Your physician recommends that you schedule a follow-up appointment in: 4 weeks from 08/28/15 with Gypsy Balsam, NP post ablation    Any Other Special Instructions Will Be Listed Below (If Applicable).  Cardiac Ablation Cardiac ablation is a procedure to disable a small amount of heart tissue in very specific places. The heart has many electrical connections. Sometimes these connections are abnormal and can cause the heart to beat very fast or irregularly. By disabling some of the problem areas, heart rhythm can be improved or made normal. Ablation is done for people who:   Have Wolff-Parkinson-White syndrome.   Have other fast heart rhythms (tachycardia).   Have taken medicines for an abnormal heart rhythm (arrhythmia) that resulted in:   No success.   Side effects.   May have a high-risk heartbeat that could result in death.  LET [facility] CARE PROVIDER KNOW ABOUT:   Any allergies you have or any previous reactions you have had to X-ray dye, food (such as seafood), medicine, or tape.   All medicines you are taking, including vitamins,  herbs, eye drops, creams, and over-the-counter medicines.   Previous problems you or members of your family have had with the use of anesthetics.   Any blood disorders you have.   Previous surgeries or procedures (such as a kidney transplant) you have had.   Medical conditions you have (such as kidney failure).  RISKS AND COMPLICATIONS Generally, cardiac ablation is a safe procedure. However, problems can occur and include:   Increased risk of cancer. Depending on how long it takes to do the ablation, the dose of radiation can be high.  Bruising and bleeding where a thin, flexible tube (catheter) was inserted during the procedure.   Bleeding into the chest, especially into the sac that surrounds the heart (serious).  Need for a permanent pacemaker if the normal electrical system is damaged.   The procedure may not be fully effective, and this may not be recognized for months. Repeat ablation procedures are sometimes required. BEFORE THE PROCEDURE   Follow any instructions from your health care provider regarding eating and drinking before the procedure.   Take your medicines as directed at regular times with water, unless instructed otherwise by your health care provider. If you are taking diabetes medicine, including insulin, ask how you are to take it and if there are any special instructions you should follow. It is common to adjust insulin dosing the day of the ablation.  PROCEDURE  An ablation is usually performed in a catheterization laboratory with the guidance of fluoroscopy. Fluoroscopy is a type of X-ray that helps your health care provider see images of your heart during the procedure.   An ablation  is a minimally invasive procedure. This means a small cut (incision) is made in either your neck or groin. Your health care provider will decide where to make the incision based on your medical history and physical exam.  An IV tube will be started before the  procedure begins. You will be given an anesthetic or medicine to help you relax (sedative).  The skin on your neck or groin will be numbed. A needle will be inserted into a large vein in your neck or groin and catheters will be threaded to your heart.  A special dye that shows up on fluoroscopy pictures may be injected through the catheter. The dye helps your health care provider see the area of the heart that needs treatment.  The catheter has electrodes on the tip. When the area of heart tissue that is causing the arrhythmia is found, the catheter tip will send an electrical current to the area and "scar" the tissue. Three types of energy can be used to ablate the heart tissue:   Heat (radiofrequency energy).   Laser energy.   Extreme cold (cryoablation).   When the area of the heart has been ablated, the catheter will be taken out. Pressure will be held on the insertion site. This will help the insertion site clot and keep it from bleeding. A bandage will be placed on the insertion site.  AFTER THE PROCEDURE   After the procedure, you will be taken to a recovery area where your vital signs (blood pressure, heart rate, and breathing) will be monitored. The insertion site will also be monitored for bleeding.   You will need to lie still for 4-6 hours. This is to ensure you do not bleed from the catheter insertion site.  Document Released: 04/24/2009 Document Revised: 04/22/2014 Document Reviewed: 04/30/2013 [facility] Patient Information 2015 [place], [place]. This information is not intended to replace advice given to you by your health care provider. Make sure you discuss any questions you have with your health care provider.

## 2015-08-01 LAB — CUP PACEART REMOTE DEVICE CHECK: MDC IDC SESS DTM: 20160812154555

## 2015-08-07 ENCOUNTER — Encounter: Payer: Self-pay | Admitting: Cardiovascular Disease

## 2015-08-11 ENCOUNTER — Ambulatory Visit (INDEPENDENT_AMBULATORY_CARE_PROVIDER_SITE_OTHER): Payer: 59 | Admitting: *Deleted

## 2015-08-11 ENCOUNTER — Encounter: Payer: Self-pay | Admitting: Cardiovascular Disease

## 2015-08-11 DIAGNOSIS — R002 Palpitations: Secondary | ICD-10-CM | POA: Diagnosis not present

## 2015-08-13 NOTE — Progress Notes (Signed)
Loop recorder 

## 2015-08-14 ENCOUNTER — Encounter: Payer: Self-pay | Admitting: Cardiovascular Disease

## 2015-08-21 ENCOUNTER — Other Ambulatory Visit (INDEPENDENT_AMBULATORY_CARE_PROVIDER_SITE_OTHER): Payer: 59 | Admitting: *Deleted

## 2015-08-21 DIAGNOSIS — I471 Supraventricular tachycardia: Secondary | ICD-10-CM

## 2015-08-21 LAB — CBC WITH DIFFERENTIAL/PLATELET
BASOS ABS: 0 10*3/uL (ref 0.0–0.1)
Basophils Relative: 0.6 % (ref 0.0–3.0)
Eosinophils Absolute: 0.1 10*3/uL (ref 0.0–0.7)
Eosinophils Relative: 1.2 % (ref 0.0–5.0)
HEMATOCRIT: 35.9 % — AB (ref 36.0–46.0)
HEMOGLOBIN: 12 g/dL (ref 12.0–15.0)
LYMPHS PCT: 27.2 % (ref 12.0–46.0)
Lymphs Abs: 1.4 10*3/uL (ref 0.7–4.0)
MCHC: 33.5 g/dL (ref 30.0–36.0)
MCV: 86.4 fl (ref 78.0–100.0)
MONOS PCT: 4.5 % (ref 3.0–12.0)
Monocytes Absolute: 0.2 10*3/uL (ref 0.1–1.0)
Neutro Abs: 3.4 10*3/uL (ref 1.4–7.7)
Neutrophils Relative %: 66.5 % (ref 43.0–77.0)
Platelets: 210 10*3/uL (ref 150.0–400.0)
RBC: 4.15 Mil/uL (ref 3.87–5.11)
RDW: 13.7 % (ref 11.5–15.5)
WBC: 5 10*3/uL (ref 4.0–10.5)

## 2015-08-21 LAB — BASIC METABOLIC PANEL
BUN: 13 mg/dL (ref 6–23)
CALCIUM: 8.1 mg/dL — AB (ref 8.4–10.5)
CO2: 28 meq/L (ref 19–32)
Chloride: 103 mEq/L (ref 96–112)
Creatinine, Ser: 0.76 mg/dL (ref 0.40–1.20)
GFR: 86.53 mL/min (ref >60.00)
Glucose, Bld: 89 mg/dL (ref 70–99)
Potassium: 3.9 mEq/L (ref 3.5–5.1)
SODIUM: 139 meq/L (ref 135–145)

## 2015-08-28 ENCOUNTER — Encounter (HOSPITAL_COMMUNITY): Payer: Self-pay | Admitting: Internal Medicine

## 2015-08-28 ENCOUNTER — Ambulatory Visit (HOSPITAL_COMMUNITY)
Admission: RE | Admit: 2015-08-28 | Discharge: 2015-08-29 | Disposition: A | Discharge status: Home / Self Care | Payer: 59 | Source: Ambulatory Visit | Attending: Internal Medicine | Admitting: Internal Medicine

## 2015-08-28 ENCOUNTER — Encounter (HOSPITAL_COMMUNITY)
Admission: RE | Disposition: A | Discharge status: Home / Self Care | Payer: Self-pay | Source: Ambulatory Visit | Attending: Internal Medicine

## 2015-08-28 DIAGNOSIS — E039 Hypothyroidism, unspecified: Secondary | ICD-10-CM | POA: Diagnosis not present

## 2015-08-28 DIAGNOSIS — I471 Supraventricular tachycardia, unspecified: Secondary | ICD-10-CM | POA: Diagnosis present

## 2015-08-28 HISTORY — PX: ELECTROPHYSIOLOGIC STUDY: SHX172A

## 2015-08-28 HISTORY — PX: EP IMPLANTABLE DEVICE: SHX172B

## 2015-08-28 SURGERY — A-FLUTTER/A-TACH/SVT ABLATION

## 2015-08-28 MED ORDER — FENTANYL CITRATE (PF) 100 MCG/2ML IJ SOLN
INTRAMUSCULAR | Status: AC
  Filled 2015-08-28: qty 4

## 2015-08-28 MED ORDER — ONDANSETRON HCL 4 MG/2ML IJ SOLN: 4.0000 mg | Freq: Four times a day (QID) | INTRAMUSCULAR | Status: DC | PRN

## 2015-08-28 MED ORDER — SODIUM CHLORIDE 0.9 % IJ SOLN
3.0000 mL | Freq: Two times a day (BID) | INTRAMUSCULAR | Status: DC
  Administered 2015-08-28 (×2): 3 mL via INTRAVENOUS

## 2015-08-28 MED ORDER — MIDAZOLAM HCL 5 MG/5ML IJ SOLN
INTRAMUSCULAR | Status: AC
  Filled 2015-08-28: qty 25

## 2015-08-28 MED ORDER — BUPIVACAINE HCL (PF) 0.25 % IJ SOLN
INTRAMUSCULAR | Status: AC
  Filled 2015-08-28: qty 60

## 2015-08-28 MED ORDER — BUPIVACAINE HCL (PF) 0.25 % IJ SOLN
INTRAMUSCULAR | Status: DC | PRN
  Administered 2015-08-28: 40 mL
  Administered 2015-08-28: 10 mL

## 2015-08-28 MED ORDER — ACETAMINOPHEN 325 MG PO TABS
650.0000 mg | ORAL_TABLET | ORAL | Status: DC | PRN
  Administered 2015-08-28: 650 mg via ORAL
  Filled 2015-08-28: qty 2

## 2015-08-28 MED ORDER — LIDOCAINE-EPINEPHRINE 1 %-1:100000 IJ SOLN
INTRAMUSCULAR | Status: AC
  Filled 2015-08-28: qty 1

## 2015-08-28 MED ORDER — FENTANYL CITRATE (PF) 100 MCG/2ML IJ SOLN
INTRAMUSCULAR | Status: DC | PRN
  Administered 2015-08-28: 12.5 ug via INTRAVENOUS
  Administered 2015-08-28: 25 ug via INTRAVENOUS
  Administered 2015-08-28 (×3): 12.5 ug via INTRAVENOUS
  Administered 2015-08-28: 25 ug via INTRAVENOUS
  Administered 2015-08-28 (×4): 12.5 ug via INTRAVENOUS

## 2015-08-28 MED ORDER — SODIUM CHLORIDE 0.9 % IJ SOLN: 3.0000 mL | INTRAMUSCULAR | Status: DC | PRN

## 2015-08-28 MED ORDER — LIDOCAINE HCL (PF) 1 % IJ SOLN
INTRAMUSCULAR | Status: DC | PRN
  Administered 2015-08-28: 20 mL via INTRADERMAL

## 2015-08-28 MED ORDER — ACETAMINOPHEN 325 MG PO TABS: 650.0000 mg | ORAL_TABLET | ORAL | Status: AC | PRN

## 2015-08-28 MED ORDER — MIDAZOLAM HCL 5 MG/5ML IJ SOLN
INTRAMUSCULAR | Status: DC | PRN
  Administered 2015-08-28: 1 mg via INTRAVENOUS
  Administered 2015-08-28: 2 mg via INTRAVENOUS
  Administered 2015-08-28 (×4): 1 mg via INTRAVENOUS
  Administered 2015-08-28: 2 mg via INTRAVENOUS
  Administered 2015-08-28 (×3): 1 mg via INTRAVENOUS
  Administered 2015-08-28: 2 mg via INTRAVENOUS

## 2015-08-28 MED ORDER — MIDAZOLAM HCL 5 MG/5ML IJ SOLN
INTRAMUSCULAR | Status: AC
Start: 2015-08-28 — End: 2015-08-28
  Filled 2015-08-28: qty 25

## 2015-08-28 MED ORDER — SODIUM CHLORIDE 0.9 % IV SOLN: 250.0000 mL | INTRAVENOUS | Status: DC | PRN

## 2015-08-28 SURGICAL SUPPLY — 12 items
BAG SNAP BAND KOVER 36X36 (MISCELLANEOUS) ×1 IMPLANT
CATH HEX JOSEPH 2-5-2 65CM 6F (CATHETERS) ×1 IMPLANT
CATH JOSEPHSON QUAD-ALLRED 6FR (CATHETERS) ×2 IMPLANT
CATH THERMISTOR 7FR 4MM (ABLATOR) ×1 IMPLANT
PACK EP LATEX FREE (CUSTOM PROCEDURE TRAY) ×2
PACK EP LF (CUSTOM PROCEDURE TRAY) IMPLANT
PACK LOOP INSERTION (CUSTOM PROCEDURE TRAY) ×2 IMPLANT
PAD DEFIB LIFELINK (PAD) ×1 IMPLANT
SHEATH PINNACLE 6F 10CM (SHEATH) ×2 IMPLANT
SHEATH PINNACLE 7F 10CM (SHEATH) ×1 IMPLANT
SHEATH PINNACLE 8F 10CM (SHEATH) ×1 IMPLANT
SHIELD RADPAD SCOOP 12X17 (MISCELLANEOUS) ×1 IMPLANT

## 2015-08-28 NOTE — H&P (Signed)
HPI Cheryl Irwin is referred today by Dr. Graciela Husbands for evaluation of SVT. She is a pleasant 49 yo woman with palpitations who has had symptoms which have waxed and waned for years. She had a ILR inserted in 2014. She was found to have SVT at 200/min. She notes associated sob, chest pressure, neck and jaw discomfort and near syncope. The patient was seen in the ER a couple of weeks ago and was observed overnight. In SVT she had a small elevation of her troponin. She was given a prescription for propranolol which she has not taken.  Allergies  Allergen Reactions  . Sulfa Antibiotics Hives  . Sunscreens Hives     Current Outpatient Prescriptions  Medication Sig Dispense Refill  . estradiol (ESTRACE) 2 MG tablet Take 2 mg by mouth every evening.     Marland Kitchen levothyroxine (SYNTHROID, LEVOTHROID) 75 MCG tablet Take 75 mcg by mouth every morning.     . Multiple Vitamin (MULTIVITAMIN WITH MINERALS) TABS tablet Take 1 tablet by mouth every other day.     . propranolol (INDERAL) 10 MG tablet Take 1 tablet (10mg ) by mouth daily as needed for palpitations. May repeat every 30 minutes for 3 doses total. 30 tablet 1   No current facility-administered medications for this visit.     Past Medical History  Diagnosis Date  . Hypothyroidism     a. H/o Hashimoto's, s/p thyroid lobe removal and parathyroid removal.  . PFO (patent foramen ovale)   . Palpitations 07/2013    Loop Recorder Implanted  . SVD (spontaneous vaginal delivery)     x 2  . Headache     otc med prn  . Neurologic abnormality     a. Age 5 - neurological event, some MRI abnormalities that resolved and diagnosed as possible multiple sclerosis versus atypical migraine. b. Aphasia in 2015 - CT head negative, question migraine.  . Iliac vessel injury     a. During surgery to fix ureter she states there was injury to right iliac and leg went gangrenous thus a stent was  placed by vascular surgery  . Nephrolithiasis     a. During hysterectomy for endometriosis, she states her ureter was nicked and since that time she has had issues with kidney stones. In fixing the ureter she reports there was damage to her iliac artery and she received a stent.  . Renal cyst     a. Pt reports upper quadrant kidney cyst being followed for now @ [facility].    ROS:  All systems reviewed and negative except as noted in the HPI.   Past Surgical History  Procedure Laterality Date  . Abdominal hysterectomy  29 yrs olds  . Kidney surgery  2003    to fix blockage in uretra  . Lymph node biopsy  2001  . Breast implant exchange  2008  . Tonsillectomy  as a child  . Elbow surgery Left 2010  . Loop recorder implant N/A 07/27/2013    Procedure: LOOP RECORDER IMPLANT; Surgeon: Thurmon Fair, MD; Location: [facility] CATH LAB; Service: Cardiovascular; Laterality: N/A;  . Thyroidectomy    . Parathyroidectomy      partial  . Wisdom tooth extraction    . Breast surgery      augmentation, new implants  . Kidney stone surgery      mutiple  . Vulvectomy partial Bilateral 03/13/2015    Procedure: VULVECTOMY PARTIAL; Surgeon: Shea Evans, MD; Location: [facility] ORS; Service: Gynecology; Laterality: Bilateral;     Family History  Problem Relation Age of Onset  . Cancer Mother   . High blood pressure Mother   . Hyperlipidemia Mother   . Lupus Mother   . Hypertension Father   . Hyperlipidemia Father      History   Social History  . Marital Status: Legally Separated    Spouse Name: N/A  . Number of Children: N/A  . Years of Education: N/A   Occupational History  . Not on file.   Social History Main Topics  . Smoking status: Never Smoker   . Smokeless tobacco: Never Used  . Alcohol Use: 1.8 - 2.4 oz/week    3-4 Standard  drinks or equivalent per week     Comment: social - 3-4 glasses of wine a week  . Drug Use: No  . Sexual Activity: Yes    Birth Control/ Protection: Surgical   Other Topics Concern  . Not on file   Social History Narrative     BP 110/68 mmHg  Pulse 65  Ht 5' 3.5" (1.613 m)  Wt 109 lb 3.2 oz (49.533 kg)  BMI 19.04 kg/m2  Physical Exam:  Well appearing 49 yo woman who looks younger than her stated age, NAD HEENT: Unremarkable Neck: No JVD, no thyromegally Back: No CVA tenderness Lungs: Clear with no wheezes HEART: Regular rate rhythm, no murmurs, no rubs, no clicks Abd: soft, positive bowel sounds, no organomegally, no rebound, no guarding Ext: 2 plus pulses, no edema, no cyanosis, no clubbing Skin: No rashes no nodules Neuro: CN II through XII intact, motor grossly intact  EKG - reviewed - NSR with no pre-excitation  Assess/Plan:            SVT (supraventricular tachycardia) - Marinus Maw, MD at 07/24/2015 1:49 PM     Status: Written Related Problem: SVT (supraventricular tachycardia)   Expand All Collapse All   I have discussed the treatment options with the patient in detail. She has SVT at 200/min and has not tolerated medical therapy. She wishes to pursue catheter ablation. The risks/benefits/goals/expectations of the procedure have been discussed with the patient and she wishes to proceed.         EP Attending  Patient seen and examined. Since her last clinic visit, no change in the history, exam, assessment and plan. For EPS/RFA of SVT. We will remove her ILR after the procedure.  Leonia Reeves.D.

## 2015-08-28 NOTE — Discharge Instructions (Signed)
Call [facility] at [phone] if any bleeding, swelling or drainage at cath site.  May shower, no tub baths for 48 hours for groin sticks. No lifting over 5 pounds for 7 days.  No Driving for 7 days No sexual activity for 1 week.  Heart healthy diet

## 2015-08-28 NOTE — Progress Notes (Signed)
Site area: rt IJ Site Prior to Removal:  Level O Pressure Applied For:8 minutes Manual:   yes Patient Status During Pull:  sleeping Post Pull Site:  Level O Post Pull Instructions Given:  yes Post Pull Pulses Present: na Dressing Applied:  tegaderm Bedrest begins @  Comments:

## 2015-08-28 NOTE — Progress Notes (Signed)
Site area: rt groin 3 fv sheaths Site Prior to Removal:  Level 0 Pressure Applied For:  20 minutes Manual:   yes Patient Status During Pull:  stable Post Pull Site:  Level  0 Post Pull Instructions Given:  yes Post Pull Pulses Present: yes Dressing Applied:  Small tegaderm   Bedrest begins @ 1325 Comments:  IV saline locked

## 2015-08-29 DIAGNOSIS — E039 Hypothyroidism, unspecified: Secondary | ICD-10-CM | POA: Diagnosis not present

## 2015-08-29 DIAGNOSIS — I471 Supraventricular tachycardia: Secondary | ICD-10-CM | POA: Diagnosis not present

## 2015-08-29 NOTE — Progress Notes (Signed)
Patient ID: Edman Circle, female   DOB: 1966/07/29, 49 y.o.   MRN: [id number]    Patient Name: Cheryl Irwin Date of Encounter: 08/29/2015     Active Problems:   SVT (supraventricular tachycardia)    SUBJECTIVE  No palpitations or sob.   CURRENT MEDS . sodium chloride  3 mL Intravenous Q12H    OBJECTIVE  Filed Vitals:   08/28/15 1807 08/28/15 1907 08/28/15 2017 08/29/15 0500  BP: 102/72 100/70 97/59 97/64   Pulse: 67 65 67 64  Temp:   97.8 F (36.6 C) 98 F (36.7 C)  TempSrc:   Oral Oral  Resp: Height:      Weight:    110 lb 14.4 oz (50.304 kg)  SpO2: 100% 99% 99% 99%    Intake/Output Summary (Last 24 hours) at 08/29/15 0743 Last data filed at 08/28/15 2217  Gross per 24 hour  Intake    153 ml  Output    350 ml  Net   -197 ml   Filed Weights   08/28/15 0732 08/29/15 0500  Weight: 109 lb (49.442 kg) 110 lb 14.4 oz (50.304 kg)    PHYSICAL EXAM  General: Pleasant, NAD. Neuro: Alert and oriented X 3. Moves all extremities spontaneously. Psych: Normal affect. HEENT:  Normal  Neck: Supple without bruits or JVD. Lungs:  Resp regular and unlabored, CTA. Heart: RRR no s3, s4, or murmurs. Abdomen: Soft, non-tender, non-distended, BS + x 4.  Extremities: No clubbing, cyanosis or edema. DP/PT/Radials 2+ and equal bilaterally.  Accessory Clinical Findings  CBC No results for input(s): WBC, NEUTROABS, HGB, HCT, MCV, PLT in the last 72 hours. Basic Metabolic Panel No results for input(s): NA, K, CL, CO2, GLUCOSE, BUN, CREATININE, CALCIUM, MG, PHOS in the last 72 hours. Liver Function Tests No results for input(s): AST, ALT, ALKPHOS, BILITOT, PROT, ALBUMIN in the last 72 hours. No results for input(s): LIPASE, AMYLASE in the last 72 hours. Cardiac Enzymes No results for input(s): CKTOTAL, CKMB, CKMBINDEX, TROPONINI in the last 72 hours. BNP Invalid input(s): POCBNP D-Dimer No results for input(s): DDIMER in the last 72 hours. Hemoglobin  A1C No results for input(s): HGBA1C in the last 72 hours. Fasting Lipid Panel No results for input(s): CHOL, HDL, LDLCALC, TRIG, CHOLHDL, LDLDIRECT in the last 72 hours. Thyroid Function Tests No results for input(s): TSH, T4TOTAL, T3FREE, THYROIDAB in the last 72 hours.  Invalid input(s): FREET3  TELE  NSR  Radiology/Studies  No results found.  ASSESSMENT AND PLAN  1. 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 She is doing well. Ok for discharge home. Followup with me in 3-4 weeks. She can resume unlimited activity in a week. No heavy lifting until then.   Gregg Taylor,M.D.  08/29/2015 7:43 AM

## 2015-08-29 NOTE — Discharge Summary (Signed)
  Physician Discharge Summary  Patient ID: Cheryl Irwin MRN: [id number] DOB/AGE: [birth year] 49 y.o.  Primary Electrophysiologist: Dr. Graciela Husbands  Admit date: 08/28/2015 Discharge date: 08/29/2015  Admission Diagnoses: SVT  Discharge Diagnoses:  Active Problems:   SVT (supraventricular tachycardia)   Discharged Condition: stable  Hospital Course: 49 y/o female who presented to [facility] on 08/28/15 for elective SVT ablation. The procedure was performed by Dr. Ladona Ridgel. She underwent successful ablation of AVNRT. She tolerated the procedure well and left the EP lab in stable condition. She was monitored overnight and had no post procedural complications. She was last seen and examined by Dr. Ladona Ridgel who determined she was stable for discharge home. F/u has been arranged with Dr. Ladona Ridgel in 6 weeks.   Consults: None  Significant Diagnostic Studies: See EP Study Procedure Note  Treatments: SVT ablation   Discharge Exam: Blood pressure 97/64, pulse 64, temperature 98 F (36.7 C), temperature source Oral, resp. rate 18, height 5' 3.5" (1.613 m), weight 110 lb 14.4 oz (50.304 kg), SpO2 99 %.   Disposition: 01-Home or Self Care     Medication List    TAKE these medications        acetaminophen 325 MG tablet  Commonly known as:  TYLENOL  Take 2 tablets (650 mg total) by mouth every 4 (four) hours as needed for headache or mild pain.     estradiol 2 MG tablet  Commonly known as:  ESTRACE  Take 2 mg by mouth every evening.     levothyroxine 75 MCG tablet  Commonly known as:  SYNTHROID, LEVOTHROID  Take 75 mcg by mouth every morning.     multivitamin with minerals Tabs tablet  Take 1 tablet by mouth every other day.     propranolol 10 MG tablet  Commonly known as:  INDERAL  Take 1 tablet ( ) by mouth daily as needed for palpitations. May repeat every 30 minutes for 3 doses total.       Follow-up Information    Follow up with Lewayne Bunting, MD On 09/25/2015.   Specialty:  Cardiology     Why:  See provider at 10:45 am, please arrive 15 minutes early for paperwork.   Contact information:   [address] [place] [address] [phone]      TIME SPENT ON DISCHARGE, INCLUDING PHYSICIAN TIME: >30 MINUTES  Signed: Robbie Lis 08/29/2015, 2:20 PM  EP Attending  Patient seen and examined. Agree with above. She is stable for discharge.   Leonia Reeves.D.

## 2015-09-01 ENCOUNTER — Encounter: Payer: Self-pay | Admitting: Cardiovascular Disease

## 2015-09-09 ENCOUNTER — Ambulatory Visit (INDEPENDENT_AMBULATORY_CARE_PROVIDER_SITE_OTHER): Payer: 59 | Admitting: *Deleted

## 2015-09-09 DIAGNOSIS — R002 Palpitations: Secondary | ICD-10-CM

## 2015-09-09 NOTE — Progress Notes (Signed)
Loop recorder 

## 2015-09-10 ENCOUNTER — Encounter: Payer: Self-pay | Admitting: Cardiology

## 2015-09-13 LAB — CUP PACEART REMOTE DEVICE CHECK: MDC IDC SESS DTM: 20160920110522

## 2015-09-13 NOTE — Progress Notes (Signed)
Carelink summary report received. Battery status OK. Normal device function. No new symptom episodes, tachy episodes, brady, or pause episodes. No new AF episodes. Monthly summary reports and ROV with GT on 09/25/15 at 10:45am.

## 2015-09-15 LAB — CUP PACEART REMOTE DEVICE CHECK: MDC IDC SESS DTM: 20160821103617

## 2015-09-15 NOTE — Progress Notes (Signed)
Carelink summary report received. Battery status OK. Normal device function. No new brady, pause, or AF episodes. 1 symptom episode, no abnormalities on strip. 13 tachy episodes--SVT, rates 190s-200bpm. Monthly summary reports and plan for SVT ablation on 08/28/15.

## 2015-09-24 ENCOUNTER — Ambulatory Visit: Payer: 59 | Admitting: Nurse Practitioner

## 2015-09-25 ENCOUNTER — Encounter: Payer: Self-pay | Admitting: Internal Medicine

## 2015-09-25 ENCOUNTER — Ambulatory Visit (INDEPENDENT_AMBULATORY_CARE_PROVIDER_SITE_OTHER): Payer: 59 | Admitting: Internal Medicine

## 2015-09-25 ENCOUNTER — Encounter: Payer: Self-pay | Admitting: Cardiovascular Disease

## 2015-09-25 VITALS — BP 102/76 | HR 66 | Ht 63.5 in | Wt 112.8 lb

## 2015-09-25 DIAGNOSIS — R55 Syncope and collapse: Secondary | ICD-10-CM

## 2015-09-25 DIAGNOSIS — I471 Supraventricular tachycardia: Secondary | ICD-10-CM

## 2015-09-25 NOTE — Progress Notes (Signed)
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 She is a pleasant 49 yo woman with a h/o tachypalpitations and documented SVT at over 200/min. She underwent successful EP study and catheter ablation several weeks ago. In the interim, she has done well with no recurrent SVT. We removed her ILR. Overall she is doing well and has no complaint today.  Allergies  Allergen Reactions  . Sulfa Antibiotics Hives  . Sunscreens Hives     Current Outpatient Prescriptions  Medication Sig Dispense Refill  . acetaminophen (TYLENOL) 325 MG tablet Take 2 tablets (650 mg total) by mouth every 4 (four) hours as needed for headache or mild pain.    Marland Kitchen estradiol (ESTRACE) 2 MG tablet Take 2 mg by mouth every evening.     Marland Kitchen levothyroxine (SYNTHROID, LEVOTHROID) 75 MCG tablet Take 75 mcg by mouth every morning.     . Multiple Vitamin (MULTIVITAMIN WITH MINERALS) TABS tablet Take 1 tablet by mouth every other day.     . propranolol (INDERAL) 10 MG tablet Take 1 tablet ( ) by mouth daily as needed for palpitations. May repeat every 30 minutes for 3 doses total. 30 tablet 1   No current facility-administered medications for this visit.     Past Medical History  Diagnosis Date  . Hypothyroidism     a. H/o Hashimoto's, s/p thyroid lobe removal and parathyroid removal.  . PFO (patent foramen ovale)   . Palpitations 07/2013    Loop Recorder Implanted  . SVD (spontaneous vaginal delivery)     x 2  . Headache     otc med prn  . Neurologic abnormality     a. Age 46 - neurological event, some MRI abnormalities that resolved and diagnosed as possible multiple sclerosis versus atypical migraine. b. Aphasia in 2015 - CT head negative, question migraine.  . Iliac vessel injury     a. During surgery to fix ureter she states there was injury to right iliac and leg went gangrenous thus a stent was placed by vascular surgery  . Nephrolithiasis     a. During hysterectomy for endometriosis, she states her ureter was  nicked and since that time she has had issues with kidney stones. In fixing the ureter she reports there was damage to her iliac artery and she received a stent.  . Renal cyst     a. Pt reports upper quadrant kidney cyst being followed for now @ [facility].    ROS:   All systems reviewed and negative except as noted in the HPI.   Past Surgical History  Procedure Laterality Date  . Abdominal hysterectomy  29 yrs olds  . Kidney surgery  2003    to fix blockage in uretra  . Lymph node biopsy  2001  . Breast implant exchange  2008  . Tonsillectomy  as a child  . Elbow surgery Left 2010  . Loop recorder implant N/A 07/27/2013    Procedure: LOOP RECORDER IMPLANT;  Surgeon: Thurmon Fair, MD;  Location: [facility] CATH LAB;  Service: Cardiovascular;  Laterality: N/A;  . Thyroidectomy    . Parathyroidectomy      partial  . Wisdom tooth extraction    . Breast surgery      augmentation, new implants  . Kidney stone surgery      mutiple  . Vulvectomy partial Bilateral 03/13/2015    Procedure: VULVECTOMY PARTIAL;  Surgeon: Shea Evans, MD;  Location: [facility] ORS;  Service: Gynecology;  Laterality: Bilateral;  . Electrophysiologic study  N/A 08/28/2015    Procedure: SVT Ablation;  Surgeon: Marinus Maw, MD;  Location[facility] LAB;  Service: Cardiovascular;  Laterality: N/A;  . Ep implantable device N/A 08/28/2015    Procedure: Loop Recorder Removal;  Surgeon: Marinus Maw, MD;  Location: [facility] INVASIVE CV LAB;  Service: Cardiovascular;  Laterality: N/A;     Family History  Problem Relation Age of Onset  . Cancer Mother   . High blood pressure Mother   . Hyperlipidemia Mother   . Lupus Mother   . Hypertension Father   . Hyperlipidemia Father      Social History   Social History  . Marital Status: Legally Separated    Spouse Name: N/A  . Number of Children: N/A  . Years of Education: N/A   Occupational History  . Not on file.   Social History Main Topics  . Smoking status: Never Smoker     . Smokeless tobacco: Never Used  . Alcohol Use: 1.8 - 2.4 oz/week    3-4 Standard drinks or equivalent per week     Comment: social - 3-4 glasses of wine a week  . Drug Use: No  . Sexual Activity: Yes    Birth Control/ Protection: Surgical   Other Topics Concern  . Not on file   Social History Narrative     Ht 5' 3.5" (1.613 m)  Wt 112 lb 12.8 oz (51.166 kg)  BMI 19.67 kg/m2  Physical Exam:  Well appearing 49 yo woman, NAD HEENT: Unremarkable Neck:  6 cm JVD, no thyromegally Lymphatics:  No adenopathy Back:  No CVA tenderness Lungs:  Clear with no wheezes HEART:  Regular rate rhythm, no murmurs, no rubs, no clicks Abd:  soft, positive bowel sounds, no organomegally, no rebound, no guarding Ext:  2 plus pulses, no edema, no cyanosis, no clubbing Skin:  No rashes no nodules Neuro:  CN II through XII intact, motor grossly intact  EKG - nsr    Assess/Plan:

## 2015-09-25 NOTE — Patient Instructions (Signed)
Your physician recommends that you schedule a follow-up appointment in: as needed  

## 2015-09-25 NOTE — Assessment & Plan Note (Signed)
She is s/p catheter ablation. She has no evidence of recurrent SVT. She will undergo watchful waiting. Will follow.

## 2015-09-25 NOTE — Assessment & Plan Note (Signed)
Her ILR has been removed. No additional syncope which was related to her SVT.

## 2015-09-30 ENCOUNTER — Other Ambulatory Visit: Payer: Self-pay

## 2015-09-30 DIAGNOSIS — Z1231 Encounter for screening mammogram for malignant neoplasm of breast: Secondary | ICD-10-CM

## 2015-10-31 ENCOUNTER — Ambulatory Visit: Payer: 59

## 2015-11-06 ENCOUNTER — Telehealth: Payer: Self-pay | Admitting: Internal Medicine

## 2015-11-06 NOTE — Telephone Encounter (Signed)
Request for surgical clearance:  1. What type of surgery is being performed? Plastic Surgery (Breast)   2. When is this surgery scheduled? Not scheduled. Waiting on clearance. Shooting for 11/20/2015  3. Are there any medications that need to be held prior to surgery and how long? Blood thinners and held two weeks prior and after    4. Name of physician performing surgery? Dr. Etter Sjogrenavid Bowers   5. What is your office phone and fax number? Fax : [phone]

## 2015-11-07 NOTE — Telephone Encounter (Signed)
Clearance done and faxed by Selena BattenKim in medical records

## 2018-02-28 ENCOUNTER — Encounter (HOSPITAL_COMMUNITY): Payer: Self-pay | Admitting: Emergency Medicine

## 2018-02-28 DIAGNOSIS — M62838 Other muscle spasm: Secondary | ICD-10-CM | POA: Insufficient documentation

## 2018-02-28 DIAGNOSIS — R112 Nausea with vomiting, unspecified: Secondary | ICD-10-CM | POA: Insufficient documentation

## 2018-02-28 DIAGNOSIS — R197 Diarrhea, unspecified: Secondary | ICD-10-CM | POA: Diagnosis not present

## 2018-02-28 DIAGNOSIS — Z79899 Other long term (current) drug therapy: Secondary | ICD-10-CM | POA: Diagnosis not present

## 2018-02-28 LAB — CBC
HCT: 37.9 % (ref 36.0–46.0)
Hemoglobin: 12.8 g/dL (ref 12.0–15.0)
MCH: 29.4 pg (ref 26.0–34.0)
MCHC: 33.8 g/dL (ref 30.0–36.0)
MCV: 87.1 fL (ref 78.0–100.0)
Platelets: 229 10*3/uL (ref 150–400)
RBC: 4.35 MIL/uL (ref 3.87–5.11)
RDW: 13.2 % (ref 11.5–15.5)
WBC: 7.8 10*3/uL (ref 4.0–10.5)

## 2018-02-28 LAB — BASIC METABOLIC PANEL
Anion gap: 14 (ref 5–15)
BUN: 24 mg/dL — AB (ref 6–20)
CO2: 20 mmol/L — ABNORMAL LOW (ref 22–32)
CREATININE: 0.73 mg/dL (ref 0.44–1.00)
Calcium: 7.7 mg/dL — ABNORMAL LOW (ref 8.9–10.3)
Chloride: 104 mmol/L (ref 101–111)
GFR calc Af Amer: >60 mL/min (ref >60)
Glucose, Bld: 121 mg/dL — ABNORMAL HIGH (ref 65–99)
Potassium: 3.4 mmol/L — ABNORMAL LOW (ref 3.5–5.1)
SODIUM: 138 mmol/L (ref 135–145)

## 2018-02-28 NOTE — ED Triage Notes (Signed)
Per EMS, patient c/o generalized weakness progressing to severe muscle cramping in bilateral upper extremities, jaw, and face. Reports N/V/D x4 hours. [place]  20g RAC 50mcg Fentanyl with EMS

## 2018-03-01 ENCOUNTER — Emergency Department (HOSPITAL_COMMUNITY)
Admission: EM | Admit: 2018-03-01 | Discharge: 2018-03-01 | Disposition: A | Discharge status: Home / Self Care | Payer: BLUE CROSS/BLUE SHIELD | Attending: Emergency Medicine | Admitting: Emergency Medicine

## 2018-03-01 ENCOUNTER — Encounter (HOSPITAL_COMMUNITY): Payer: Self-pay | Admitting: Emergency Medicine

## 2018-03-01 ENCOUNTER — Other Ambulatory Visit: Payer: Self-pay

## 2018-03-01 DIAGNOSIS — R112 Nausea with vomiting, unspecified: Secondary | ICD-10-CM

## 2018-03-01 DIAGNOSIS — M62838 Other muscle spasm: Secondary | ICD-10-CM

## 2018-03-01 DIAGNOSIS — R197 Diarrhea, unspecified: Secondary | ICD-10-CM

## 2018-03-01 HISTORY — DX: Supraventricular tachycardia: I47.1

## 2018-03-01 HISTORY — DX: Supraventricular tachycardia, unspecified: I47.10

## 2018-03-01 LAB — I-STAT TROPONIN, ED: TROPONIN I, POC: 0 ng/mL (ref 0.00–0.08)

## 2018-03-01 LAB — TSH: TSH: 0.645 u[IU]/mL (ref 0.350–4.500)

## 2018-03-01 LAB — I-STAT BETA HCG BLOOD, ED (MC, WL, AP ONLY)

## 2018-03-01 LAB — CBG MONITORING, ED: GLUCOSE-CAPILLARY: 135 mg/dL — AB (ref 65–99)

## 2018-03-01 MED ORDER — CALCIUM CARBONATE-VITAMIN D 500-200 MG-UNIT PO TABS: 1.0000 | ORAL_TABLET | Freq: Two times a day (BID) | ORAL | 0 refills | Status: AC

## 2018-03-01 MED ORDER — ONDANSETRON 8 MG PO TBDP: ORAL_TABLET | ORAL | 0 refills | Status: AC

## 2018-03-01 MED ORDER — CALCIUM CARBONATE ANTACID 500 MG PO CHEW
2.0000 | CHEWABLE_TABLET | Freq: Once | ORAL | Status: AC
  Administered 2018-03-01: 400 mg via ORAL
  Filled 2018-03-01: qty 2

## 2018-03-01 MED ORDER — ONDANSETRON HCL 4 MG/2ML IJ SOLN
4.0000 mg | Freq: Once | INTRAMUSCULAR | Status: AC
  Administered 2018-03-01: 4 mg via INTRAVENOUS
  Filled 2018-03-01: qty 2

## 2018-03-01 MED ORDER — KETOROLAC TROMETHAMINE 30 MG/ML IJ SOLN
15.0000 mg | Freq: Once | INTRAMUSCULAR | Status: AC
  Administered 2018-03-01: 15 mg via INTRAVENOUS
  Filled 2018-03-01: qty 1

## 2018-03-01 MED ORDER — SODIUM CHLORIDE 0.9 % IV BOLUS (SEPSIS)
1000.0000 mL | Freq: Once | INTRAVENOUS | Status: AC
  Administered 2018-03-01: 1000 mL via INTRAVENOUS

## 2018-03-01 NOTE — ED Provider Notes (Addendum)
[facility] DEPT Provider Note   CSN: [id number] Arrival date & time: 02/28/18  2235     History   Chief Complaint Chief Complaint  Patient presents with  . Weakness  . Spasms    HPI Cheryl Irwin is a 52 y.o. female.  The history is provided by the patient.  Diarrhea   This is a new problem. The current episode started 3 to 5 hours ago. The problem occurs 2 to 4 times per day. The problem has not changed since onset.The stool consistency is described as watery. There has been no fever. Associated symptoms include vomiting. Pertinent negatives include no abdominal pain, no chills, no sweats, no headaches, no arthralgias, no myalgias, no URI and no cough. She has tried nothing for the symptoms. The treatment provided significant relief. Her past medical history does not include irritable bowel syndrome, inflammatory bowel disease, short gut syndrome or malabsorption.  Also had nausea, and emesis.  No f/c/r.  Symptoms just started within the past 4-5 hours.  None while on vacation, it came on suddenly this evening.  States she feels her thyroid is off because she had this once when her thyroid was off.  No swelling of the eyes or shins.  TSH was 2.93 2 weeks ago, see care everywhere.  States she felt globally fatigued and had spasms all over with the emesis and diarrhea.  No CP, DOE, sob.  No cough, no fevers.  States she had skin lesions on back and thighs while on vacation and she thought they were related to sun exposure.    Past Medical History:  Diagnosis Date  . Headache    otc med prn  . Hypothyroidism    a. H/o Hashimoto's, s/p thyroid lobe removal and parathyroid removal.  . Iliac vessel injury    a. During surgery to fix ureter she states there was injury to right iliac and leg went gangrenous thus a stent was placed by vascular surgery  . Nephrolithiasis    a. During hysterectomy for endometriosis, she states her ureter was nicked and since  that time she has had issues with kidney stones. In fixing the ureter she reports there was damage to her iliac artery and she received a stent.  . Neurologic abnormality    a. Age 73 - neurological event, some MRI abnormalities that resolved and diagnosed as possible multiple sclerosis versus atypical migraine. b. Aphasia in 2015 - CT head negative, question migraine.  . Palpitations 07/2013   Loop Recorder Implanted  . PFO (patent foramen ovale)   . Renal cyst    a. Pt reports upper quadrant kidney cyst being followed for now @ [facility].  . SVD (spontaneous vaginal delivery)    x 2  . SVT (supraventricular tachycardia) [facility])     Patient Active Problem List   Diagnosis Date Noted  . Supraventricular tachycardia ([place])   . SVT (supraventricular tachycardia) ([place]) 07/13/2015  . Syncope 01/14/2015  . Palpitations 07/27/2013  . PFO (patent foramen ovale) 07/19/2013  . Chest pain 07/19/2013    Past Surgical History:  Procedure Laterality Date  . ABDOMINAL HYSTERECTOMY  17 yrs olds  . BREAST IMPLANT EXCHANGE  2008  . BREAST SURGERY     augmentation, new implants  . ELBOW SURGERY Left 2010  . ELECTROPHYSIOLOGIC STUDY N/A 08/28/2015   Procedure: SVT Ablation;  Surgeon: Evans Lance, MD;  Location: [facility] CV LAB;  Service: Cardiovascular;  Laterality: N/A;  . EP IMPLANTABLE DEVICE N/A  08/28/2015   Procedure: Loop Recorder Removal;  Surgeon: Evans Lance, MD;  Location: [facility] CV LAB;  Service: Cardiovascular;  Laterality: N/A;  . KIDNEY STONE SURGERY     mutiple  . KIDNEY SURGERY  2003   to fix blockage in uretra  . LOOP RECORDER IMPLANT N/A 07/27/2013   Procedure: LOOP RECORDER IMPLANT;  Surgeon: Sanda Klein, MD;  Location: [facility] CATH LAB;  Service: Cardiovascular;  Laterality: N/A;  . LYMPH NODE BIOPSY  2001  . PARATHYROIDECTOMY     partial  . THYROIDECTOMY    . TONSILLECTOMY  as a child  . VULVECTOMY PARTIAL Bilateral 03/13/2015   Procedure: VULVECTOMY PARTIAL;  Surgeon:  Azucena Fallen, MD;  Location: [facility] ORS;  Service: Gynecology;  Laterality: Bilateral;  . WISDOM TOOTH EXTRACTION      OB History    No data available       Home Medications    Prior to Admission medications   Medication Sig Start Date End Date Taking? Authorizing Provider  acetaminophen (TYLENOL) 325 MG tablet Take 2 tablets (650 mg total) by mouth every 4 (four) hours as needed for headache or mild pain. 08/28/15   Isaiah Serge, NP  estradiol (ESTRACE) 2 MG tablet Take 2 mg by mouth every evening.     [provider]  levothyroxine (SYNTHROID, LEVOTHROID) 75 MCG tablet Take 75 mcg by mouth every morning.     [provider]  Multiple Vitamin (MULTIVITAMIN WITH MINERALS) TABS tablet Take 1 tablet by mouth every other day.     [provider]  propranolol (INDERAL) 10 MG tablet Take 1 tablet (38m) by mouth daily as needed for palpitations. May repeat every 30 minutes for 3 doses total. 07/14/15   DCharlie Pitter PA-C    Family History Family History  Problem Relation Age of Onset  . Cancer Mother   . High blood pressure Mother   . Hyperlipidemia Mother   . Lupus Mother   . Hypertension Father   . Hyperlipidemia Father     Social History Social History   Tobacco Use  . Smoking status: Never Smoker  . Smokeless tobacco: Never Used  Substance Use Topics  . Alcohol use: Yes    Alcohol/week: 1.8 - 2.4 oz    Types: 3 - 4 Standard drinks or equivalent per week    Comment: social - 3-4 glasses of wine a week  . Drug use: No     Allergies   Sulfa antibiotics and Sunscreens   Review of Systems Review of Systems  Constitutional: Positive for fatigue. Negative for chills, diaphoresis and fever.  HENT: Negative for mouth sores, sore throat, trouble swallowing and voice change.        No trismus  Eyes: Negative for photophobia and visual disturbance.  Respiratory: Negative for cough and shortness of breath.   Cardiovascular: Negative for chest pain,  palpitations and leg swelling.  Gastrointestinal: Positive for diarrhea, nausea and vomiting. Negative for abdominal distention, abdominal pain, anal bleeding and blood in stool.  Genitourinary: Negative for dysuria.  Musculoskeletal: Negative for arthralgias, back pain, myalgias, neck pain and neck stiffness.  Skin: Negative for color change, pallor, rash and wound.  Neurological: Negative for dizziness, tremors, seizures, syncope, facial asymmetry, speech difficulty, weakness, light-headedness, numbness and headaches.  All other systems reviewed and are negative.    Physical Exam Updated Vital Signs BP 105/75   Pulse 85   Temp 98.3 F (36.8 C) (Oral)   Resp 17  SpO2 98%   Physical Exam  Constitutional: She is oriented to person, place, and time. She appears well-developed and well-nourished. No distress.  Very well appearing  HENT:  Head: Normocephalic and atraumatic.  Right Ear: External ear normal.  Left Ear: External ear normal.  Nose: Nose normal.  Mouth/Throat: Oropharynx is clear and moist. No trismus in the jaw. No uvula swelling. No oropharyngeal exudate.  Eyes: Conjunctivae and EOM are normal. Pupils are equal, round, and reactive to light.  No periorbital edema  Neck: Normal range of motion. Neck supple. No JVD present.  Cardiovascular: Normal rate, regular rhythm, normal heart sounds and intact distal pulses.  Pulmonary/Chest: Effort normal and breath sounds normal. No stridor. No respiratory distress. She has no wheezes. She has no rales.  Abdominal: Soft. Bowel sounds are normal. She exhibits no mass. There is no tenderness. There is no rebound and no guarding. No hernia.  Musculoskeletal: Normal range of motion. She exhibits no edema or tenderness.       Right shoulder: She exhibits no bony tenderness, no crepitus, no spasm, normal pulse and normal strength.  No pretibial edema.    Lymphadenopathy:       Head (right side): No submental, no submandibular, no  tonsillar, no preauricular, no posterior auricular and no occipital adenopathy present.       Head (left side): No submental, no submandibular, no tonsillar, no preauricular, no posterior auricular and no occipital adenopathy present.    She has no cervical adenopathy.       Right cervical: No superficial cervical, no deep cervical and no posterior cervical adenopathy present.      Left cervical: No superficial cervical, no deep cervical and no posterior cervical adenopathy present.    She has no axillary adenopathy.       Right: No epitrochlear adenopathy present.       Left: No epitrochlear adenopathy present.  Neurological: She is alert and oriented to person, place, and time. She displays normal reflexes. No sensory deficit. She exhibits normal muscle tone. Coordination normal.  No Trousseaus sign no Chvostek sign  Skin: Skin is warm and dry. Capillary refill takes less than 2 seconds. No rash noted. No erythema.  Skin examined thorough with 2 nurses present there are no skin lesions  Psychiatric: Her mood appears anxious.  Nursing note and vitals reviewed.    ED Treatments / Results  Labs (all labs ordered are listed, but only abnormal results are displayed) Results for orders placed or performed during the hospital encounter of 12/45/80  Basic metabolic panel  Result Value Ref Range   Sodium 138 135 - 145 mmol/L   Potassium 3.4 (L) 3.5 - 5.1 mmol/L   Chloride 104 101 - 111 mmol/L   CO2 20 (L) 22 - 32 mmol/L   Glucose, Bld 121 (H) 65 - 99 mg/dL   BUN 24 (H) 6 - 20 mg/dL   Creatinine, Ser 0.73 0.44 - 1.00 mg/dL   Calcium 7.7 (L) 8.9 - 10.3 mg/dL   GFR calc non Af Amer >60 >60 mL/min   GFR calc Af Amer >60 >60 mL/min   Anion gap 14 5 - 15  CBC  Result Value Ref Range   WBC 7.8 4.0 - 10.5 K/uL   RBC 4.35 3.87 - 5.11 MIL/uL   Hemoglobin 12.8 12.0 - 15.0 g/dL   HCT 37.9 36.0 - 46.0 %   MCV 87.1 78.0 - 100.0 fL   MCH 29.4 26.0 - 34.0 pg   MCHC  33.8 30.0 - 36.0 g/dL   RDW  13.2 11.5 - 15.5 %   Platelets 229 150 - 400 K/uL  CBG monitoring, ED  Result Value Ref Range   Glucose-Capillary 135 (H) 65 - 99 mg/dL  I-Stat beta hCG blood, ED  Result Value Ref Range   I-stat hCG, quantitative <5.0 <5 mIU/mL   Comment 3           No results found.   Procedures Procedures (including critical care time)  Date: 03/01/2018  Rate: 78  Rhythm: normal sinus rhythm  QRS Axis: normal  Intervals: normal  ST/T Wave abnormalities: normal  Conduction Disutrbances: none  Narrative Interpretation: unremarkable     Medications Ordered in ED Medications  sodium chloride 0.9 % bolus 1,000 mL (1,000 mLs Intravenous New Bag/Given 03/01/18 0243)  ondansetron (ZOFRAN) injection 4 mg (4 mg Intravenous Given 03/01/18 0243)  calcium carbonate (TUMS - dosed in mg elemental calcium) chewable tablet 400 mg of elemental calcium (400 mg of elemental calcium Oral Given 03/01/18 0243)    PO challenged successfully in the ED.    Patient stated she feels she is hypothyroid.  She had a normal test 2 weeks ago.  But given her concerns I have sent the test off and have told her it will not return this evening and to have her endocrinologist follow up on this test using care everywhere.  She and her significant other verbalize understanding and agree to follow up.   She has no spasms on exam and no Trousseau or Chvostek sign.  She was given oral calcium as her Calcium has trended down slightly.  She had 3 non bloody episodes of diarrhea.  The patient would like to be tested for a myriad of diseases as she does not believe she has the norovirus.  She  cannot produce stool in the ED.  I do not believe imaging is indicated as the patient's exam is benign and reassuring.  It was reported to me by nurse that she would like to be admitted but she is well appearing with normal vitals and normal exam and there is no indication for admission.      I believe give that [place] this is most likely the norovirus and is self limited.  I understand the patient's concerns but I see no signs of life threatening illness requiring admission at this time.  I will start calcium supplementation and have patient follow up with her PMD for recheck.  Patient and significant other verbalize understanding and agree to follow up.    Moreover, she is not febrile, stool was non bloody, abdomen is non tender and stool has resolved.  I do not believe she requires antibiotics at this time.    I suspect the patient began to panic when she experiencing her symptoms and this caused the symptoms she experienced.  She seem anxious on exam and I believe that more testing will not alleviate this.    As EDP was about to discharge patient, patient now reporting ongoing chest pain.  This was not reported earlier.  We will do an ekg and one troponin if normal patient will be discharged  PERC negative and wells 0 highly doubt PE in this low risk patient.    Final Clinical Impressions(s) / ED Diagnoses   Follow up with your PMD for recheck.  She will need a repeat calcium in one week.  Will start Ca and vitamin  D.  I see no signs of myxedema.  Moreover, she had a normal TSH 2 weeks ago and TSh did result in the ED an is within normal range.  She is stable for discharge with close follow up and has PO challenged successfully in the ED. Negative EKG and troponin.    Return for weakness, numbness, changes in vision or speech, fevers >100.4 unrelieved by medication, shortness of breath, intractable vomiting, or diarrhea, abdominal pain, Inability to tolerate liquids or food, cough, altered mental status or any concerns. No signs of systemic illness or infection. The patient is nontoxic-appearing on exam and vital signs are within normal limits.   I have reviewed the triage vital signs and the nursing notes. Pertinent labs &imaging results that were available during my care of the patient were  reviewed by me and considered in my medical decision making (see chart for details).  After history, exam, and medical workup I feel the patient has been appropriately medically screened and is safe for discharge home. Pertinent diagnoses were discussed with the patient. Patient was given return precautions.   Chameka Mcmullen, MD 03/01/18 [place], Armin Yerger, MD 03/01/18 2336

## 2019-05-10 ENCOUNTER — Telehealth: Payer: Self-pay

## 2019-05-10 NOTE — Telephone Encounter (Signed)
   [facility] Pre-operative Risk Assessment    Request for surgical clearance:  1. What type of surgery is being performed? EXTRACTION OF THREE TEETH  2. When is this surgery scheduled? 05/57/20   3. What type of clearance is required (medical clearance vs. Pharmacy clearance to hold med vs. Both)? MEDICAL  4. Are there any medications that need to be held prior to surgery and how long? NONE    5. Practice name and name of physician performing surgery? [facility]    6. What is your office phone number [phone]    7.   What is your office fax number [phone]  8.   Anesthesia type (None, local, MAC, general) ? INTRAVENOUS SEDATION USING FENTANYL, VERSED AND POSSIBLY PROPOFOL    Jacinta Shoe 05/10/2019, 1:15 PM  _________________________________________________________________   (provider comments below)

## 2019-05-10 NOTE — Telephone Encounter (Signed)
   Primary Cardiologist: Patient has not been seen in our office since 2016  Chart reviewed as part of pre-operative protocol coverage. Simple dental extractions are considered low risk procedures per guidelines and generally do not require any specific cardiac clearance. It is also generally accepted that for simple extractions and dental cleanings, there is no need to interrupt blood thinner therapy.   SBE prophylaxis is not required for the patient.  This patient has not been seen by our office since 2016, at which time she was without CAD or CHF.   I will route this recommendation to the requesting party via Epic fax function and remove from pre-op pool.  Please call with questions.  Beatriz Stallion, PA-C 05/10/2019, 1:58 PM

## 2020-02-29 ENCOUNTER — Ambulatory Visit: Payer: Self-pay | Attending: Internal Medicine

## 2020-02-29 DIAGNOSIS — Z23 Encounter for immunization: Secondary | ICD-10-CM

## 2020-02-29 NOTE — Progress Notes (Signed)
   Covid-19 Vaccination Clinic  Name:  Cheryl Irwin    MRN: [id number] DOB: 1966-11-28  02/29/2020  [name] was observed post Covid-19 immunization for 15 minutes without incident. She was provided with Vaccine Information Sheet and instruction to access the V-Safe system.   [name] was instructed to call 911 with any severe reactions post vaccine: Marland Kitchen Difficulty breathing  . Swelling of face and throat  . A fast heartbeat  . A bad rash all over body  . Dizziness and weakness   Immunizations Administered    Name Date Dose VIS Date Route   Pfizer COVID-19 Vaccine 02/29/2020  9:51 AM 0.3 mL 11/30/2019 Intramuscular   Manufacturer: [employer], [employer]   Lot: [id number]   NDC: [address]-2

## 2020-03-24 ENCOUNTER — Ambulatory Visit: Payer: Self-pay | Attending: Internal Medicine

## 2020-03-24 DIAGNOSIS — Z23 Encounter for immunization: Secondary | ICD-10-CM

## 2020-03-24 NOTE — Progress Notes (Signed)
   Covid-19 Vaccination Clinic  Name:  Cheryl Irwin    MRN: [id number] DOB: 1966-11-13  03/24/2020  Ms. North was observed post Covid-19 immunization for 15 minutes without incident. She was provided with Vaccine Information Sheet and instruction to access the V-Safe system.   [name] was instructed to call 911 with any severe reactions post vaccine: Marland Kitchen Difficulty breathing  . Swelling of face and throat  . A fast heartbeat  . A bad rash all over body  . Dizziness and weakness   Immunizations Administered    Name Date Dose VIS Date Route   Pfizer COVID-19 Vaccine 03/24/2020 11:47 AM 0.3 mL 11/30/2019 Intramuscular   Manufacturer: [employer], [employer]   Lot: [id number]   NDC: [address]-3
